# Patient Record
Sex: Male | Born: 1940 | Race: White | Hispanic: No | Smoking: Former smoker
Health system: Southern US, Community
[De-identification: ages and names within clinical notes are randomized; demographics above are authoritative.]

## PROBLEM LIST (undated history)

## (undated) DIAGNOSIS — I2510 Atherosclerotic heart disease of native coronary artery without angina pectoris: Secondary | ICD-10-CM

## (undated) DIAGNOSIS — J45909 Unspecified asthma, uncomplicated: Secondary | ICD-10-CM

## (undated) DIAGNOSIS — G4730 Sleep apnea, unspecified: Secondary | ICD-10-CM

## (undated) DIAGNOSIS — L84 Corns and callosities: Secondary | ICD-10-CM

## (undated) DIAGNOSIS — Z79899 Other long term (current) drug therapy: Secondary | ICD-10-CM

## (undated) DIAGNOSIS — I10 Essential (primary) hypertension: Secondary | ICD-10-CM

## (undated) DIAGNOSIS — M150 Primary generalized (osteo)arthritis: Secondary | ICD-10-CM

## (undated) DIAGNOSIS — F17200 Nicotine dependence, unspecified, uncomplicated: Secondary | ICD-10-CM

## (undated) DIAGNOSIS — M7741 Metatarsalgia, right foot: Secondary | ICD-10-CM

## (undated) DIAGNOSIS — E782 Mixed hyperlipidemia: Secondary | ICD-10-CM

## (undated) DIAGNOSIS — E538 Deficiency of other specified B group vitamins: Secondary | ICD-10-CM

## (undated) DIAGNOSIS — G4700 Insomnia, unspecified: Secondary | ICD-10-CM

## (undated) DIAGNOSIS — E119 Type 2 diabetes mellitus without complications: Secondary | ICD-10-CM

## (undated) DIAGNOSIS — K635 Polyp of colon: Secondary | ICD-10-CM

## (undated) DIAGNOSIS — G20 Parkinson's disease: Secondary | ICD-10-CM

## (undated) DIAGNOSIS — M722 Plantar fascial fibromatosis: Secondary | ICD-10-CM

## (undated) DIAGNOSIS — N400 Enlarged prostate without lower urinary tract symptoms: Secondary | ICD-10-CM

## (undated) DIAGNOSIS — Z01818 Encounter for other preprocedural examination: Secondary | ICD-10-CM

## (undated) DIAGNOSIS — F419 Anxiety disorder, unspecified: Secondary | ICD-10-CM

## (undated) DIAGNOSIS — J449 Chronic obstructive pulmonary disease, unspecified: Secondary | ICD-10-CM

## (undated) HISTORY — PX: CATARACT EXTRACTION: SUR2

## (undated) HISTORY — DX: Polyp of colon: K63.5

## (undated) HISTORY — DX: Anxiety disorder, unspecified: F41.9

## (undated) HISTORY — PX: SHOULDER SURGERY: SHX246

## (undated) HISTORY — DX: Corns and callosities: L84

## (undated) HISTORY — DX: Chronic obstructive pulmonary disease, unspecified: J44.9

## (undated) HISTORY — DX: Unspecified asthma, uncomplicated: J45.909

## (undated) HISTORY — DX: Metatarsalgia, right foot: M77.41

## (undated) HISTORY — DX: Sleep apnea, unspecified: G47.30

## (undated) HISTORY — PX: OTHER SURGICAL HISTORY: SHX169

## (undated) HISTORY — DX: Other long term (current) drug therapy: Z79.899

## (undated) HISTORY — PX: KNEE SURGERY: SHX244

## (undated) HISTORY — PX: BACK SURGERY: SHX140

## (undated) HISTORY — DX: Essential (primary) hypertension: I10

## (undated) HISTORY — DX: Plantar fascial fibromatosis: M72.2

## (undated) HISTORY — DX: Deficiency of other specified B group vitamins: E53.8

## (undated) HISTORY — DX: Nicotine dependence, unspecified, uncomplicated: F17.200

## (undated) HISTORY — DX: Atherosclerotic heart disease of native coronary artery without angina pectoris: I25.10

## (undated) HISTORY — DX: Benign prostatic hyperplasia without lower urinary tract symptoms: N40.0

## (undated) HISTORY — DX: Parkinson's disease: G20

## (undated) HISTORY — DX: Primary generalized (osteo)arthritis: M15.0

## (undated) HISTORY — DX: Type 2 diabetes mellitus without complications: E11.9

## (undated) HISTORY — DX: Mixed hyperlipidemia: E78.2

## (undated) HISTORY — DX: Insomnia, unspecified: G47.00

## (undated) HISTORY — DX: Encounter for other preprocedural examination: Z01.818

---

## 2002-11-10 ENCOUNTER — Encounter: Admission: RE | Admit: 2002-11-10 | Discharge: 2002-11-10 | Payer: Self-pay | Admitting: Unknown Physician Specialty

## 2002-11-10 ENCOUNTER — Encounter: Payer: Self-pay | Admitting: Unknown Physician Specialty

## 2008-01-12 ENCOUNTER — Encounter: Admission: RE | Admit: 2008-01-12 | Discharge: 2008-01-12 | Payer: Self-pay | Admitting: Internal Medicine

## 2015-04-10 DIAGNOSIS — F17200 Nicotine dependence, unspecified, uncomplicated: Secondary | ICD-10-CM

## 2015-04-10 DIAGNOSIS — I2510 Atherosclerotic heart disease of native coronary artery without angina pectoris: Secondary | ICD-10-CM

## 2015-04-10 DIAGNOSIS — IMO0001 Reserved for inherently not codable concepts without codable children: Secondary | ICD-10-CM | POA: Insufficient documentation

## 2015-04-10 HISTORY — DX: Atherosclerotic heart disease of native coronary artery without angina pectoris: I25.10

## 2015-04-10 HISTORY — DX: Reserved for inherently not codable concepts without codable children: IMO0001

## 2015-04-10 HISTORY — DX: Nicotine dependence, unspecified, uncomplicated: F17.200

## 2015-08-31 DIAGNOSIS — J45909 Unspecified asthma, uncomplicated: Secondary | ICD-10-CM

## 2015-08-31 DIAGNOSIS — E119 Type 2 diabetes mellitus without complications: Secondary | ICD-10-CM

## 2015-08-31 DIAGNOSIS — E782 Mixed hyperlipidemia: Secondary | ICD-10-CM

## 2015-08-31 DIAGNOSIS — N400 Enlarged prostate without lower urinary tract symptoms: Secondary | ICD-10-CM

## 2015-08-31 DIAGNOSIS — G4730 Sleep apnea, unspecified: Secondary | ICD-10-CM

## 2015-08-31 DIAGNOSIS — G20 Parkinson's disease: Secondary | ICD-10-CM

## 2015-08-31 DIAGNOSIS — F419 Anxiety disorder, unspecified: Secondary | ICD-10-CM

## 2015-08-31 DIAGNOSIS — I10 Essential (primary) hypertension: Secondary | ICD-10-CM

## 2015-08-31 DIAGNOSIS — Z79899 Other long term (current) drug therapy: Secondary | ICD-10-CM

## 2015-08-31 DIAGNOSIS — J4489 Other specified chronic obstructive pulmonary disease: Secondary | ICD-10-CM

## 2015-08-31 DIAGNOSIS — R7303 Prediabetes: Secondary | ICD-10-CM

## 2015-08-31 DIAGNOSIS — G4700 Insomnia, unspecified: Secondary | ICD-10-CM

## 2015-08-31 DIAGNOSIS — I25812 Atherosclerosis of bypass graft of coronary artery of transplanted heart without angina pectoris: Secondary | ICD-10-CM

## 2015-08-31 DIAGNOSIS — I2510 Atherosclerotic heart disease of native coronary artery without angina pectoris: Secondary | ICD-10-CM

## 2015-08-31 DIAGNOSIS — J449 Chronic obstructive pulmonary disease, unspecified: Secondary | ICD-10-CM

## 2015-08-31 DIAGNOSIS — G20A1 Parkinson's disease without dyskinesia, without mention of fluctuations: Secondary | ICD-10-CM

## 2015-08-31 HISTORY — DX: Atherosclerotic heart disease of native coronary artery without angina pectoris: I25.10

## 2015-08-31 HISTORY — DX: Essential (primary) hypertension: I10

## 2015-08-31 HISTORY — DX: Parkinson's disease without dyskinesia, without mention of fluctuations: G20.A1

## 2015-08-31 HISTORY — DX: Anxiety disorder, unspecified: F41.9

## 2015-08-31 HISTORY — DX: Chronic obstructive pulmonary disease, unspecified: J44.9

## 2015-08-31 HISTORY — DX: Unspecified asthma, uncomplicated: J45.909

## 2015-08-31 HISTORY — DX: Other specified chronic obstructive pulmonary disease: J44.89

## 2015-08-31 HISTORY — DX: Benign prostatic hyperplasia without lower urinary tract symptoms: N40.0

## 2015-08-31 HISTORY — DX: Mixed hyperlipidemia: E78.2

## 2015-08-31 HISTORY — DX: Type 2 diabetes mellitus without complications: E11.9

## 2015-08-31 HISTORY — DX: Sleep apnea, unspecified: G47.30

## 2015-08-31 HISTORY — DX: Parkinson's disease: G20

## 2015-08-31 HISTORY — DX: Atherosclerosis of bypass graft of coronary artery of transplanted heart without angina pectoris: I25.812

## 2015-08-31 HISTORY — DX: Other long term (current) drug therapy: Z79.899

## 2015-08-31 HISTORY — DX: Insomnia, unspecified: G47.00

## 2015-08-31 HISTORY — DX: Prediabetes: R73.03

## 2015-09-03 DIAGNOSIS — M8949 Other hypertrophic osteoarthropathy, multiple sites: Secondary | ICD-10-CM

## 2015-09-03 DIAGNOSIS — E538 Deficiency of other specified B group vitamins: Secondary | ICD-10-CM | POA: Insufficient documentation

## 2015-09-03 DIAGNOSIS — M150 Primary generalized (osteo)arthritis: Secondary | ICD-10-CM

## 2015-09-03 DIAGNOSIS — M159 Polyosteoarthritis, unspecified: Secondary | ICD-10-CM

## 2015-09-03 DIAGNOSIS — K635 Polyp of colon: Secondary | ICD-10-CM

## 2015-09-03 HISTORY — DX: Primary generalized (osteo)arthritis: M15.0

## 2015-09-03 HISTORY — DX: Polyp of colon: K63.5

## 2015-09-03 HISTORY — DX: Deficiency of other specified B group vitamins: E53.8

## 2015-09-03 HISTORY — DX: Other hypertrophic osteoarthropathy, multiple sites: M89.49

## 2015-09-03 HISTORY — DX: Polyosteoarthritis, unspecified: M15.9

## 2015-12-11 DIAGNOSIS — M7741 Metatarsalgia, right foot: Secondary | ICD-10-CM

## 2015-12-11 DIAGNOSIS — L84 Corns and callosities: Secondary | ICD-10-CM

## 2015-12-11 HISTORY — DX: Metatarsalgia, right foot: M77.41

## 2015-12-11 HISTORY — DX: Corns and callosities: L84

## 2016-03-11 DIAGNOSIS — M722 Plantar fascial fibromatosis: Secondary | ICD-10-CM

## 2016-03-11 HISTORY — DX: Plantar fascial fibromatosis: M72.2

## 2016-04-17 ENCOUNTER — Other Ambulatory Visit: Payer: Self-pay

## 2016-04-17 ENCOUNTER — Encounter: Payer: Self-pay | Admitting: Sports Medicine

## 2016-04-17 ENCOUNTER — Ambulatory Visit (INDEPENDENT_AMBULATORY_CARE_PROVIDER_SITE_OTHER): Payer: Medicare Other | Admitting: Sports Medicine

## 2016-04-17 DIAGNOSIS — Q828 Other specified congenital malformations of skin: Secondary | ICD-10-CM

## 2016-04-17 DIAGNOSIS — I739 Peripheral vascular disease, unspecified: Secondary | ICD-10-CM

## 2016-04-17 DIAGNOSIS — M79671 Pain in right foot: Secondary | ICD-10-CM

## 2016-04-17 DIAGNOSIS — B359 Dermatophytosis, unspecified: Secondary | ICD-10-CM | POA: Diagnosis not present

## 2016-04-17 NOTE — Progress Notes (Signed)
Subjective: Allen Barnes is a 75 y.o. male patient who presents to office for evaluation of Right foot pain secondary to callus skin. Patient complains of pain at the lesion present Right heel. Patient has tried padding with no relief in symptoms. Patient denies any other pedal complaints.   Patient Active Problem List   Diagnosis Date Noted  . Plantar fasciitis of right foot 03/11/2016  . Callus of foot 12/11/2015  . Metatarsalgia of right foot 12/11/2015  . Colon polyps 09/03/2015  . Primary osteoarthritis involving multiple joints 09/03/2015  . Vitamin B12 deficiency 09/03/2015  . Anxiety disorder 08/31/2015  . Asthma 08/31/2015  . BPH without urinary obstruction 08/31/2015  . COPD (chronic obstructive pulmonary disease) with chronic bronchitis ([place]) 08/31/2015  . Coronary artery stenosis 08/31/2015  . Diabetes mellitus type 2, controlled ([place]) 08/31/2015  . Essential hypertension 08/31/2015  . High risk medication use 08/31/2015  . Insomnia 08/31/2015  . Mixed hyperlipidemia 08/31/2015  . Parkinson's disease ([place]) 08/31/2015  . Sleep apnea 08/31/2015  . Coronary artery disease involving native coronary artery of native heart without angina pectoris 04/10/2015  . Smoking 04/10/2015    No current outpatient prescriptions on file prior to visit.   No current facility-administered medications on file prior to visit.     Allergies  Allergen Reactions  . Carbidopa-Levodopa Nausea And Vomiting    Mainly vomiting  . Tramadol Rash    Objective:  General: Alert and oriented x3 in no acute distress  Dermatology: Keratotic lesion present Plantar right heel skin lines transversing the lesion, pain is present with direct pressure to the lesion with a central nucleated core noted, dry, scaly skin plantar surfaces bilateral, consistent with tinea, no webspace macerations, no ecchymosis bilateral, all nails x 10 are thick but well manicured.  Vascular: Dorsalis Pedis 1 out of 4 and  Posterior Tibial pedal pulses 0 out of 4, Capillary Fill Time 5 seconds, no pedal hair growth bilateral, no edema bilateral lower extremities, Temperature gradient decreased bilateral.  Neurology: Gross sensation intact via light touch bilateral.  Musculoskeletal: Extreme tenderness with palpation at the keratotic lesion site on right heel, Muscular strength 5/5 in all groups without pain or limitation on range of motion. No lower extremity muscular or boney deformity noted however there appears to be some atrophy to the limbs.  Assessment and Plan: Problem List Items Addressed This Visit    None    Visit Diagnoses    Porokeratosis    -  Primary   Pain of right heel       Tinea       PAD (peripheral artery disease) (HCC)       Relevant Medications   losartan (COZAAR) 100 MG tablet     -Complete examination performed -Discussed treatment options for porokeratosis and tinea -For symptomatic relief, injected right heel with 1:1 mixture of 1% lidocaine plain and 0.5% Marcaine plain, so that I trimmed down the keratotic lesion -Parred keratoic lesion using a 15 blade; treated the area with offloading pad and antibiotic cream and Band-Aid. Advised patient to do the same at home until seen next visit -Encouraged daily skin emollients and to pick up over-the-counter cream for athlete's foot -Advised good supportive shoes and inserts -Ordered vascular studies in the setting of decreased pedal pulses 2 evaluate for peripheral arterial disease. Since patient is a smoker with complicated medical history including coronary artery disease with stenosis, hypertension, COPD Parkinson's and a past history of diabetes, however patient denies current status of  diabetes -Patient to return to office  in 2 weeks for recheck of keratosis site right heel or sooner if condition worsens.  Landis Martins, DPM

## 2016-04-18 ENCOUNTER — Telehealth: Payer: Self-pay | Admitting: *Deleted

## 2016-04-18 DIAGNOSIS — R0989 Other specified symptoms and signs involving the circulatory and respiratory systems: Secondary | ICD-10-CM

## 2016-04-18 NOTE — Telephone Encounter (Addendum)
-----   Message from Landis Martins, [place] sent at 04/17/2016  9:06 AM EDT ----- Regarding: Vascular studies Diminished pedal pulses ABI/PVRs bilateral   Dr. Cannon Kettle. 04/18/2016-Orders faxed to [facility].

## 2016-05-01 ENCOUNTER — Ambulatory Visit (INDEPENDENT_AMBULATORY_CARE_PROVIDER_SITE_OTHER): Payer: Medicare Other | Admitting: Sports Medicine

## 2016-05-01 ENCOUNTER — Encounter: Payer: Self-pay | Admitting: Sports Medicine

## 2016-05-01 DIAGNOSIS — Z09 Encounter for follow-up examination after completed treatment for conditions other than malignant neoplasm: Secondary | ICD-10-CM

## 2016-05-01 DIAGNOSIS — I739 Peripheral vascular disease, unspecified: Secondary | ICD-10-CM

## 2016-05-01 DIAGNOSIS — B359 Dermatophytosis, unspecified: Secondary | ICD-10-CM

## 2016-05-01 DIAGNOSIS — M79671 Pain in right foot: Secondary | ICD-10-CM

## 2016-05-01 DIAGNOSIS — R0989 Other specified symptoms and signs involving the circulatory and respiratory systems: Secondary | ICD-10-CM

## 2016-05-01 NOTE — Telephone Encounter (Signed)
-----   Message from Landis Martins, [place] sent at 05/01/2016 12:09 PM EDT ----- Regarding: Vascular studies We will put vascular studies on hold. Patient does not want to get them done at this time. Thanks Dr Cannon Kettle

## 2016-05-01 NOTE — Progress Notes (Signed)
Subjective: Allen Barnes is a 75 y.o. male patient who returns to office for follow up evaluation s/p excision of porokeratosis right heel. Patient states that the area "feels good" dressed area with antibiotic and padding with no issues. Patient denies any other pedal complaints.   Patient Active Problem List   Diagnosis Date Noted  . Plantar fasciitis of right foot 03/11/2016  . Callus of foot 12/11/2015  . Metatarsalgia of right foot 12/11/2015  . Colon polyps 09/03/2015  . Primary osteoarthritis involving multiple joints 09/03/2015  . Vitamin B12 deficiency 09/03/2015  . Anxiety disorder 08/31/2015  . Asthma 08/31/2015  . BPH without urinary obstruction 08/31/2015  . COPD (chronic obstructive pulmonary disease) with chronic bronchitis ([place]) 08/31/2015  . Coronary artery stenosis 08/31/2015  . Diabetes mellitus type 2, controlled ([place]) 08/31/2015  . Essential hypertension 08/31/2015  . High risk medication use 08/31/2015  . Insomnia 08/31/2015  . Mixed hyperlipidemia 08/31/2015  . Parkinson's disease ([place]) 08/31/2015  . Sleep apnea 08/31/2015  . Coronary artery disease involving native coronary artery of native heart without angina pectoris 04/10/2015  . Smoking 04/10/2015    Current Outpatient Prescriptions on File Prior to Visit  Medication Sig Dispense Refill  . acetaminophen (TYLENOL) 650 MG CR tablet Take 650 mg by mouth.    Marland Kitchen albuterol (PROVENTIL HFA;VENTOLIN HFA) 108 (90 Base) MCG/ACT inhaler Inhale into the lungs.    . ALPRAZolam (XANAX) 1 MG tablet Take one-half to one by mouth 8 hours prn rescue/anxiety.    Marland Kitchen amLODipine (NORVASC) 10 MG tablet Take 10 mg by mouth.    Marland Kitchen aspirin EC 81 MG tablet Take 81 mg by mouth.    . betamethasone acetate-betamethasone sodium phosphate (CELESTONE) 6 (3-3) MG/ML injection 3 mg.    . chlorthalidone (HYGROTON) 25 MG tablet Take 25 mg by mouth.    . Cholecalciferol (VITAMIN D3) 2000 units capsule Take by mouth.    . ciprofloxacin (CIPRO)  500 MG tablet     . Cyanocobalamin (RA VITAMIN B-12 TR) 1000 MCG TBCR Take by mouth.    . docusate (COLACE) 50 MG/5ML liquid Take 200 mg by mouth.    . furosemide (LASIX) 20 MG tablet Take 20 mg by mouth.    Marland Kitchen ketoconazole (NIZORAL) 2 % cream Apply topically.    Marland Kitchen losartan (COZAAR) 100 MG tablet     . OXYGEN by Misc.(Non-Drug; Combo Route) route.    . pravastatin (PRAVACHOL) 20 MG tablet Take 20 mg by mouth.    . tamsulosin (FLOMAX) 0.4 MG CAPS capsule      No current facility-administered medications on file prior to visit.     Allergies  Allergen Reactions  . Carbidopa-Levodopa Nausea And Vomiting    Mainly vomiting  . Tramadol Rash    Objective:  General: Alert and oriented x3 in no acute distress  Dermatology: Excision site well healed at Plantar right heel with no signs of infection, scaly skin plantar surfaces bilateral, consistent with tinea, no webspace macerations, no ecchymosis bilateral, all nails x 10 are thick but well manicured.  Vascular: Dorsalis Pedis 1 out of 4 and Posterior Tibial pedal pulses 0 out of 4, Capillary Fill Time 5 seconds, no pedal hair growth bilateral, no edema bilateral lower extremities, Temperature gradient decreased bilateral.  Neurology: Gross sensation intact via light touch bilateral.  Musculoskeletal: No tenderness to excision site at right heel, Muscular strength 5/5 in all groups without pain or limitation on range of motion. No lower extremity muscular or boney deformity  noted however there appears to be some atrophy to the limbs.  Assessment and Plan: Problem List Items Addressed This Visit    None    Visit Diagnoses    S/P excision of skin lesion, follow-up exam    -  Primary   Pain of right heel       Resolved   Diminished pulses in lower extremity       Tinea       PAD (peripheral artery disease) (HCC)         -Complete examination performed -Excision site well healed, dressings or padding no longer needed -Encouraged  continue with daily skin emollients and to pick up over-the-counter cream for athlete's foot -Advised good supportive shoes and inserts -Patient declines vascular testing at this time; states that he does not want to go to [place] to have tests done. Advised patient that we will closely monitor feet and legs and if any changes occur or pain to return to office -Patient to return to office as needed or sooner if condition worsens.  Landis Martins, DPM

## 2016-10-23 DIAGNOSIS — F17200 Nicotine dependence, unspecified, uncomplicated: Secondary | ICD-10-CM

## 2016-10-23 HISTORY — DX: Nicotine dependence, unspecified, uncomplicated: F17.200

## 2017-08-17 ENCOUNTER — Ambulatory Visit: Payer: Self-pay | Admitting: Cardiovascular Disease

## 2017-08-17 NOTE — Progress Notes (Deleted)
No chief complaint on file.  History of Present Illness: 77 yo male with history of COPD, CAD, DM, HTN, HLD, Parkinson's disease, tobacco abuse and sleep apnea who is here today as a new consult for evaluation of ***, referred by Dr. Bea Graff. He is known to have CAD. Last cardiac cath in 2007 and per records there was a 30% stenosis in the LAD. He was followed previously in [place] by Dr. Agustin Cree. He tells me that he   Primary Care Physician: Raina Mina., MD  Past Medical History:  Diagnosis Date  . Anxiety disorder 08/31/2015  . Asthma 08/31/2015  . BPH without urinary obstruction 08/31/2015  . Callus of foot 12/11/2015  . Colon polyps 09/03/2015   Overview:  [place]. 2017. Polyps.  Q 3 yrs.  Marland Kitchen COPD (chronic obstructive pulmonary disease) with chronic bronchitis ([place]) 08/31/2015   Overview:  Wears O2 at night.  . Coronary artery disease involving native coronary artery of native heart without angina pectoris 04/10/2015   Overview:  30% of LAD in 2007  . Coronary artery stenosis 08/31/2015   Overview:  30% LAD in 2007  . Diabetes mellitus type 2, controlled ([place]) 08/31/2015  . Essential hypertension 08/31/2015  . High risk medication use 08/31/2015  . Insomnia 08/31/2015  . Metatarsalgia of right foot 12/11/2015  . Mixed hyperlipidemia 08/31/2015  . Parkinson's disease ([place]) 08/31/2015  . Plantar fasciitis of right foot 03/11/2016  . Primary osteoarthritis involving multiple joints 09/03/2015  . Sleep apnea 08/31/2015  . Smoking 04/10/2015  . Vitamin B12 deficiency 09/03/2015    *** The histories are not reviewed yet. Please review them in the "History" navigator section and refresh this [place].  Current Outpatient Medications  Medication Sig Dispense Refill  . acetaminophen (TYLENOL) 650 MG CR tablet Take 650 mg by mouth.    Marland Kitchen albuterol (PROVENTIL HFA;VENTOLIN HFA) 108 (90 Base) MCG/ACT inhaler Inhale into the lungs.    . ALPRAZolam (XANAX) 1 MG tablet Take one-half to one by mouth 8 hours  prn rescue/anxiety.    Marland Kitchen amLODipine (NORVASC) 10 MG tablet Take 10 mg by mouth.    Marland Kitchen aspirin EC 81 MG tablet Take 81 mg by mouth.    . betamethasone acetate-betamethasone sodium phosphate (CELESTONE) 6 (3-3) MG/ML injection 3 mg.    . chlorthalidone (HYGROTON) 25 MG tablet Take 25 mg by mouth.    . Cholecalciferol (VITAMIN D3) 2000 units capsule Take by mouth.    . ciprofloxacin (CIPRO) 500 MG tablet     . Cyanocobalamin (RA VITAMIN B-12 TR) 1000 MCG TBCR Take by mouth.    . docusate (COLACE) 50 MG/5ML liquid Take 200 mg by mouth.    . furosemide (LASIX) 20 MG tablet Take 20 mg by mouth.    Marland Kitchen ketoconazole (NIZORAL) 2 % cream Apply topically.    Marland Kitchen losartan (COZAAR) 100 MG tablet     . OXYGEN by Misc.(Non-Drug; Combo Route) route.    . pravastatin (PRAVACHOL) 20 MG tablet Take 20 mg by mouth.    . tamsulosin (FLOMAX) 0.4 MG CAPS capsule      No current facility-administered medications for this visit.     Allergies  Allergen Reactions  . Carbidopa-Levodopa Nausea And Vomiting    Mainly vomiting  . Tramadol Rash    Social History   Socioeconomic History  . Marital status: Divorced    Spouse name: Not on file  . Number of children: Not on file  . Years of education: Not on file  .  Highest education level: Not on file  Social Needs  . Financial resource strain: Not on file  . Food insecurity - worry: Not on file  . Food insecurity - inability: Not on file  . Transportation needs - medical: Not on file  . Transportation needs - non-medical: Not on file  Occupational History  . Not on file  Tobacco Use  . Smoking status: Unknown If Ever Smoked  Substance and Sexual Activity  . Alcohol use: Not on file  . Drug use: Not on file  . Sexual activity: Not on file  Other Topics Concern  . Not on file  Social History Narrative  . Not on file    No family history on file.  Review of Systems:  As stated in the HPI and otherwise negative.   There were no vitals taken for this  visit.  Physical Examination: General: Well developed, well nourished, NAD  HEENT: OP clear, mucus membranes moist  SKIN: warm, dry. No rashes. Neuro: No focal deficits  Musculoskeletal: Muscle strength 5/5 all ext  Psychiatric: Mood and affect normal  Neck: No JVD, no carotid bruits, no thyromegaly, no lymphadenopathy.  Lungs:Clear bilaterally, no wheezes, rhonci, crackles Cardiovascular: Regular rate and rhythm. No murmurs, gallops or rubs. Abdomen:Soft. Bowel sounds present. Non-tender.  Extremities: No lower extremity edema. Pulses are 2 + in the bilateral DP/PT.  EKG:  EKG {ACTION; IS/IS [id number]} ordered today. The ekg ordered today demonstrates ***  Recent Labs: No results found for requested labs within last 8760 hours.   Lipid Panel No results found for: CHOL, TRIG, HDL, CHOLHDL, VLDL, LDLCALC, LDLDIRECT   Wt Readings from Last 3 Encounters:  No data found for Wt     Other studies Reviewed: Additional studies/ records that were reviewed today include: ***. Review of the above records demonstrates: ***   Assessment and Plan:   1. CAD without angina:   Current medicines are reviewed at length with the patient today.  The patient {ACTIONS; HAS/DOES NOT HAVE:19233} concerns regarding medicines.  The following changes have been made:  {PLAN; NO CHANGE:13088:s}  Labs/ tests ordered today include: *** No orders of the defined types were placed in this encounter.    Disposition:   FU with *** in {gen number [id number]} {TIME; UNITS DAY/WEEK/MONTH:19136}   Signed, Lauree Chandler, MD 08/17/2017 9:49 AM    [facility] [place], [place], [place]  [address] Phone: [phone]; Fax: [phone]

## 2017-08-19 ENCOUNTER — Encounter: Payer: Self-pay | Admitting: Cardiovascular Disease

## 2018-05-03 DIAGNOSIS — I272 Pulmonary hypertension, unspecified: Secondary | ICD-10-CM

## 2018-05-18 DIAGNOSIS — K8020 Calculus of gallbladder without cholecystitis without obstruction: Secondary | ICD-10-CM

## 2018-05-18 DIAGNOSIS — I6523 Occlusion and stenosis of bilateral carotid arteries: Secondary | ICD-10-CM | POA: Insufficient documentation

## 2018-05-18 HISTORY — DX: Occlusion and stenosis of bilateral carotid arteries: I65.23

## 2018-05-18 HISTORY — DX: Calculus of gallbladder without cholecystitis without obstruction: K80.20

## 2018-08-14 DIAGNOSIS — J441 Chronic obstructive pulmonary disease with (acute) exacerbation: Secondary | ICD-10-CM

## 2018-08-14 DIAGNOSIS — J101 Influenza due to other identified influenza virus with other respiratory manifestations: Secondary | ICD-10-CM

## 2018-08-14 DIAGNOSIS — D72819 Decreased white blood cell count, unspecified: Secondary | ICD-10-CM

## 2018-11-18 DIAGNOSIS — L851 Acquired keratosis [keratoderma] palmaris et plantaris: Secondary | ICD-10-CM

## 2018-11-18 HISTORY — DX: Acquired keratosis (keratoderma) palmaris et plantaris: L85.1

## 2018-12-02 DIAGNOSIS — M79671 Pain in right foot: Secondary | ICD-10-CM | POA: Insufficient documentation

## 2018-12-02 HISTORY — DX: Pain in right foot: M79.671

## 2019-02-02 DIAGNOSIS — M216X1 Other acquired deformities of right foot: Secondary | ICD-10-CM | POA: Insufficient documentation

## 2019-02-02 HISTORY — DX: Other acquired deformities of right foot: M21.6X1

## 2019-02-21 DIAGNOSIS — I700 Atherosclerosis of aorta: Secondary | ICD-10-CM

## 2019-02-21 HISTORY — DX: Atherosclerosis of aorta: I70.0

## 2019-06-02 DIAGNOSIS — R3129 Other microscopic hematuria: Secondary | ICD-10-CM | POA: Insufficient documentation

## 2019-06-02 HISTORY — DX: Other microscopic hematuria: R31.29

## 2019-07-06 ENCOUNTER — Encounter: Payer: Self-pay | Admitting: Cardiology

## 2019-07-06 ENCOUNTER — Ambulatory Visit (INDEPENDENT_AMBULATORY_CARE_PROVIDER_SITE_OTHER): Payer: Medicare Other | Admitting: Cardiology

## 2019-07-06 ENCOUNTER — Other Ambulatory Visit: Payer: Self-pay

## 2019-07-06 VITALS — BP 162/90 | HR 74 | Ht 72.0 in | Wt 198.0 lb

## 2019-07-06 DIAGNOSIS — I2510 Atherosclerotic heart disease of native coronary artery without angina pectoris: Secondary | ICD-10-CM | POA: Diagnosis not present

## 2019-07-06 DIAGNOSIS — G20 Parkinson's disease: Secondary | ICD-10-CM | POA: Diagnosis not present

## 2019-07-06 DIAGNOSIS — I10 Essential (primary) hypertension: Secondary | ICD-10-CM | POA: Diagnosis not present

## 2019-07-06 DIAGNOSIS — E782 Mixed hyperlipidemia: Secondary | ICD-10-CM | POA: Diagnosis not present

## 2019-07-06 MED ORDER — NITROGLYCERIN 0.4 MG SL SUBL: 0.4000 mg | SUBLINGUAL_TABLET | SUBLINGUAL | 1 refills | Status: DC | PRN

## 2019-07-06 MED ORDER — RANOLAZINE ER 500 MG PO TB12: 500.0000 mg | ORAL_TABLET | Freq: Two times a day (BID) | ORAL | 2 refills | Status: DC

## 2019-07-06 NOTE — Patient Instructions (Addendum)
Medication Instructions:  Your physician has recommended you make the following change in your medication:   START: Ranolazine 500 mg twice daily   TAKE as needed for chest pain: Nitroglycerin 0.4 mg sublingual (under your tongue) as needed for chest pain. If experiencing chest pain, stop what you are doing and sit down. Take 1 nitroglycerin and wait 5 minutes. If chest pain continues, take another nitroglycerin and wait 5 minutes. If chest pain does not subside, take 1 more nitroglycerin and dial 911. You make take a total of 3 nitroglycerin in a 15 minute time frame.  *If you need a refill on your cardiac medications before your next appointment, please call your pharmacy*  Lab Work: None.  If you have labs (blood work) drawn today and your tests are completely normal, you will receive your results only by: Marland Kitchen MyChart Message (if you have MyChart) OR . A paper copy in the mail If you have any lab test that is abnormal or we need to change your treatment, we will call you to review the results.  Testing/Procedures: Your physician has requested that you have a lexiscan myoview. For further information please visit [email or web address]. Please follow instruction sheet, as given.    Follow-Up: At [facility], you and your health needs are our priority.  As part of our continuing mission to provide you with exceptional heart care, we have created designated Provider Care Teams.  These Care Teams include your primary Cardiologist (physician) and Advanced Practice Providers (APPs -  Physician Assistants and Nurse Practitioners) who all work together to provide you with the care you need, when you need it.  Your next appointment:   1 month(s)  The format for your next appointment:   In Person  Provider:   Jenne Campus, MD  Other Instructions  Ranolazine tablets, extended release What is this medicine? RANOLAZINE (ra NOE la zeen) is a heart medicine. It is used to treat chronic  chest pain (angina). This medicine must be taken regularly. It will not relieve an acute episode of chest pain. This medicine may be used for other purposes; ask your health care provider or pharmacist if you have questions. COMMON BRAND NAME(S): Ranexa What should I tell my health care provider before I take this medicine? They need to know if you have any of these conditions:  heart disease  irregular heartbeat  kidney disease  liver disease  low levels of potassium or magnesium in the blood  an unusual or allergic reaction to ranolazine, other medicines, foods, dyes, or preservatives  pregnant or trying to get pregnant  breast-feeding How should I use this medicine? Take this medicine by mouth with a glass of water. Follow the directions on the prescription label. Do not cut, crush, or chew this medicine. Take with or without food. Do not take this medication with grapefruit juice. Take your doses at regular intervals. Do not take your medicine more often then directed. Talk to your pediatrician regarding the use of this medicine in children. Special care may be needed. Overdosage: If you think you have taken too much of this medicine contact a poison control center or emergency room at once. NOTE: This medicine is only for you. Do not share this medicine with others. What if I miss a dose? If you miss a dose, take it as soon as you can. If it is almost time for your next dose, take only that dose. Do not take double or extra doses. What may interact with this  medicine? Do not take this medicine with any of the following medications:  antivirals for HIV or AIDS  cerivastatin  certain antibiotics like chloramphenicol, clarithromycin, dalfopristin; quinupristin, isoniazid, rifabutin, rifampin, rifapentine  certain medicines used for cancer like imatinib, nilotinib  certain medicines for fungal infections like fluconazole, itraconazole, ketoconazole, posaconazole,  voriconazole  certain medicines for irregular heart beat like dronedarone  certain medicines for seizures like carbamazepine, fosphenytoin, oxcarbazepine, phenobarbital, phenytoin  cisapride  conivaptan  cyclosporine  grapefruit or grapefruit juice  lumacaftor; ivacaftor  nefazodone  pimozide  quinacrine  St John's wort  thioridazine This medicine may also interact with the following medications:  alfuzosin  certain medicines for depression, anxiety, or psychotic disturbances like bupropion, citalopram, fluoxetine, fluphenazine, paroxetine, perphenazine, risperidone, sertraline, trifluoperazine  certain medicines for cholesterol like atorvastatin, lovastatin, simvastatin  certain medicines for stomach problems like octreotide, palonosetron, prochlorperazine  eplerenone  ergot alkaloids like dihydroergotamine, ergonovine, ergotamine, methylergonovine  metformin  nicardipine  other medicines that prolong the QT interval (cause an abnormal heart rhythm) like dofetilide, ziprasidone  sirolimus  tacrolimus This list may not describe all possible interactions. Give your health care provider a list of all the medicines, herbs, non-prescription drugs, or dietary supplements you use. Also tell them if you smoke, drink alcohol, or use illegal drugs. Some items may interact with your medicine. What should I watch for while using this medicine? Visit your doctor for regular check ups. Tell your doctor or healthcare professional if your symptoms do not start to get better or if they get worse. This medicine will not relieve an acute attack of angina or chest pain. This medicine can change your heart rhythm. Your health care provider may check your heart rhythm by ordering an electrocardiogram (ECG) while you are taking this medicine. You may get drowsy or dizzy. Do not drive, use machinery, or do anything that needs mental alertness until you know how this medicine affects you.  Do not stand or sit up quickly, especially if you are an older patient. This reduces the risk of dizzy or fainting spells. Alcohol may interfere with the effect of this medicine. Avoid alcoholic drinks. If you are scheduled for any medical or dental procedure, tell your healthcare provider that you are taking this medicine. This medicine can interact with other medicines used during surgery. What side effects may I notice from receiving this medicine? Side effects that you should report to your doctor or health care professional as soon as possible:  allergic reactions like skin rash, itching or hives, swelling of the face, lips, or tongue  breathing problems  changes in vision  fast, irregular or pounding heartbeat  feeling faint or lightheaded, falls  low or high blood pressure  numbness or tingling feelings  ringing in the ears  tremor or shakiness  slow heartbeat (fewer than 50 beats per minute)  swelling of the legs or feet Side effects that usually do not require medical attention (report to your doctor or health care professional if they continue or are bothersome):  constipation  drowsy  dry mouth  headache  nausea or vomiting  stomach upset This list may not describe all possible side effects. Call your doctor for medical advice about side effects. You may report side effects to FDA at 1-800-FDA-1088. Where should I keep my medicine? Keep out of the reach of children. Store at room temperature between 15 and 30 degrees C (59 and 86 degrees F). Throw away any unused medicine after the expiration date. NOTE: This  sheet is a summary. It may not cover all possible information. If you have questions about this medicine, talk to your doctor, pharmacist, or health care provider.  2020 Elsevier/Gold Standard (2018-06-08 09:18:49)  Nitroglycerin sublingual tablets What is this medicine? NITROGLYCERIN (nye troe GLI ser in) is a type of vasodilator. It relaxes blood  vessels, increasing the blood and oxygen supply to your heart. This medicine is used to relieve chest pain caused by angina. It is also used to prevent chest pain before activities like climbing stairs, going outdoors in cold weather, or sexual activity. This medicine may be used for other purposes; ask your health care provider or pharmacist if you have questions. COMMON BRAND NAME(S): Nitroquick, Nitrostat, Nitrotab What should I tell my health care provider before I take this medicine? They need to know if you have any of these conditions:  anemia  head injury, recent stroke, or bleeding in the brain  liver disease  previous heart attack  an unusual or allergic reaction to nitroglycerin, other medicines, foods, dyes, or preservatives  pregnant or trying to get pregnant  breast-feeding How should I use this medicine? Take this medicine by mouth as needed. At the first sign of an angina attack (chest pain or tightness) place one tablet under your tongue. You can also take this medicine 5 to 10 minutes before an event likely to produce chest pain. Follow the directions on the prescription label. Let the tablet dissolve under the tongue. Do not swallow whole. Replace the dose if you accidentally swallow it. It will help if your mouth is not dry. Saliva around the tablet will help it to dissolve more quickly. Do not eat or drink, smoke or chew tobacco while a tablet is dissolving. If you are not better within 5 minutes after taking ONE dose of nitroglycerin, call 9-1-1 immediately to seek emergency medical care. Do not take more than 3 nitroglycerin tablets over 15 minutes. If you take this medicine often to relieve symptoms of angina, your doctor or health care professional may provide you with different instructions to manage your symptoms. If symptoms do not go away after following these instructions, it is important to call 9-1-1 immediately. Do not take more than 3 nitroglycerin tablets over  15 minutes. Talk to your pediatrician regarding the use of this medicine in children. Special care may be needed. Overdosage: If you think you have taken too much of this medicine contact a poison control center or emergency room at once. NOTE: This medicine is only for you. Do not share this medicine with others. What if I miss a dose? This does not apply. This medicine is only used as needed. What may interact with this medicine? Do not take this medicine with any of the following medications:  certain migraine medicines like ergotamine and dihydroergotamine (DHE)  medicines used to treat erectile dysfunction like sildenafil, tadalafil, and vardenafil  riociguat This medicine may also interact with the following medications:  alteplase  aspirin  heparin  medicines for high blood pressure  medicines for mental depression  other medicines used to treat angina  phenothiazines like chlorpromazine, mesoridazine, prochlorperazine, thioridazine This list may not describe all possible interactions. Give your health care provider a list of all the medicines, herbs, non-prescription drugs, or dietary supplements you use. Also tell them if you smoke, drink alcohol, or use illegal drugs. Some items may interact with your medicine. What should I watch for while using this medicine? Tell your doctor or health care professional if  you feel your medicine is no longer working. Keep this medicine with you at all times. Sit or lie down when you take your medicine to prevent falling if you feel dizzy or faint after using it. Try to remain calm. This will help you to feel better faster. If you feel dizzy, take several deep breaths and lie down with your feet propped up, or bend forward with your head resting between your knees. You may get drowsy or dizzy. Do not drive, use machinery, or do anything that needs mental alertness until you know how this drug affects you. Do not stand or sit up quickly,  especially if you are an older patient. This reduces the risk of dizzy or fainting spells. Alcohol can make you more drowsy and dizzy. Avoid alcoholic drinks. Do not treat yourself for coughs, colds, or pain while you are taking this medicine without asking your doctor or health care professional for advice. Some ingredients may increase your blood pressure. What side effects may I notice from receiving this medicine? Side effects that you should report to your doctor or health care professional as soon as possible:  blurred vision  dry mouth  skin rash  sweating  the feeling of extreme pressure in the head  unusually weak or tired Side effects that usually do not require medical attention (report to your doctor or health care professional if they continue or are bothersome):  flushing of the face or neck  headache  irregular heartbeat, palpitations  nausea, vomiting This list may not describe all possible side effects. Call your doctor for medical advice about side effects. You may report side effects to FDA at 1-800-FDA-1088. Where should I keep my medicine? Keep out of the reach of children. Store at room temperature between 20 and 25 degrees C (68 and 77 degrees F). Store in Chief of Staff. Protect from light and moisture. Keep tightly closed. Throw away any unused medicine after the expiration date. NOTE: This sheet is a summary. It may not cover all possible information. If you have questions about this medicine, talk to your doctor, pharmacist, or health care provider.  2020 Elsevier/Gold Standard (2013-04-14 17:57:36)

## 2019-07-06 NOTE — Progress Notes (Signed)
Cardiology Consultation:    Date:  07/06/2019   ID:  Allen Barnes, DOB [birth year], MRN [id number]  PCP:  Raina Mina., MD  Cardiologist:  Jenne Campus, MD   Referring MD: Raina Mina., MD   Chief Complaint  Patient presents with  . New Patient (Initial Visit)  I have chest pain  History of Present Illness:    Allen Barnes is a 79 y.o. male who is being seen today for the evaluation of chest pain at the request of Raina Mina., MD.  Possible ago history significant for coronary artery disease in 2007 he got cardiac catheterization which showed only luminal disease.  His past medical history is also significant for B12 deficiency, essential hypertension, diabetes, dyslipidemia, Parkinson.  Recently he had CT of his chest done and apparently it showed some calcification of the coronary arteries.  On top of that he also complained of having some exertional chest pain.  He said does not happen too often but when he will work hard he will develop some uneasy sensation in the chest.  This sensation goes away.  He does not have any nitroglycerin but he does take aspirin every single day.  This been going on over the last few months stable pattern no worsening in terms of duration is of reproducing symptoms as well as intensity.  Overall he still trying to be active.  And he is actually very happy to be able to see me.  He recognize me right away to have a nice conversation.  He probably told me that finally quit smoking.  He do vapes right now and I told him he needs to stop this as well but overall I congratulated him for the fact that he quit smoking.  Past Medical History:  Diagnosis Date  . Anxiety disorder 08/31/2015  . Asthma 08/31/2015  . BPH without urinary obstruction 08/31/2015  . Callus of foot 12/11/2015  . Colon polyps 09/03/2015   Overview:  [place]. 2017. Polyps.  Q 3 yrs.  Marland Kitchen COPD (chronic obstructive pulmonary disease) with chronic bronchitis ([place]) 08/31/2015   Overview:   Wears O2 at night.  . Coronary artery disease involving native coronary artery of native heart without angina pectoris 04/10/2015   Overview:  30% of LAD in 2007  . Coronary artery stenosis 08/31/2015   Overview:  30% LAD in 2007  . Diabetes mellitus type 2, controlled ([place]) 08/31/2015  . Essential hypertension 08/31/2015  . High risk medication use 08/31/2015  . Insomnia 08/31/2015  . Metatarsalgia of right foot 12/11/2015  . Mixed hyperlipidemia 08/31/2015  . Parkinson's disease ([place]) 08/31/2015  . Plantar fasciitis of right foot 03/11/2016  . Primary osteoarthritis involving multiple joints 09/03/2015  . Sleep apnea 08/31/2015  . Smoking 04/10/2015  . Vitamin B12 deficiency 09/03/2015    Past Surgical History:  Procedure Laterality Date  . BACK SURGERY    . CATARACT EXTRACTION    . heel Right   . KNEE SURGERY Right   . SHOULDER SURGERY Left     Current Medications: Current Meds  Medication Sig  . acetaminophen (TYLENOL) 650 MG CR tablet Take 650 mg by mouth.  . ALPRAZolam (XANAX) 1 MG tablet Take one-half to one by mouth 8 hours prn rescue/anxiety.  Marland Kitchen aspirin EC 81 MG tablet Take 81 mg by mouth daily.   . Cyanocobalamin (RA VITAMIN B-12 TR) 1000 MCG TBCR Take by mouth daily.   Marland Kitchen docusate sodium (COLACE) 50 MG capsule Take 50 mg by  mouth as needed for mild constipation.  . furosemide (LASIX) 20 MG tablet Take 20 mg by mouth daily.   . Garlic 123XX123 MG CAPS Take by mouth at bedtime.  Marland Kitchen losartan (COZAAR) 100 MG tablet Take 100 mg by mouth daily.   . OXYGEN 2L qhs  . pravastatin (PRAVACHOL) 20 MG tablet Take 20 mg by mouth daily.   . tamsulosin (FLOMAX) 0.4 MG CAPS capsule 0.4 mg 2 (two) times daily.   . [DISCONTINUED] albuterol (PROVENTIL HFA;VENTOLIN HFA) 108 (90 Base) MCG/ACT inhaler Inhale into the lungs.     Allergies:   Carbidopa-levodopa and Tramadol   Social History   Socioeconomic History  . Marital status: Divorced    Spouse name: Not on file  . Number of children: Not on file    . Years of education: Not on file  . Highest education level: Not on file  Occupational History  . Not on file  Tobacco Use  . Smoking status: Former Smoker    Packs/day: 0.50    Years: 27.00    Pack years: 13.50    Types: Cigarettes  . Smokeless tobacco: Former Network engineer and Sexual Activity  . Alcohol use: Yes    Alcohol/week: 1.0 standard drinks    Types: 1 Cans of beer per week  . Drug use: Never  . Sexual activity: Not Currently    Partners: Female  Other Topics Concern  . Not on file  Social History Narrative  . Not on file   Social Determinants of Health   Financial Resource Strain:   . Difficulty of Paying Living Expenses: Not on file  Food Insecurity:   . Worried About Charity fundraiser in the Last Year: Not on file  . Ran Out of Food in the Last Year: Not on file  Transportation Needs:   . Lack of Transportation (Medical): Not on file  . Lack of Transportation (Non-Medical): Not on file  Physical Activity:   . Days of Exercise per Week: Not on file  . Minutes of Exercise per Session: Not on file  Stress:   . Feeling of Stress : Not on file  Social Connections:   . Frequency of Communication with Friends and Family: Not on file  . Frequency of Social Gatherings with Friends and Family: Not on file  . Attends Religious Services: Not on file  . Active Member of Clubs or Organizations: Not on file  . Attends Archivist Meetings: Not on file  . Marital Status: Not on file     Family History: The patient's family history is not on file. ROS:   Please see the history of present illness.    All 14 point review of systems negative except as described per history of present illness.  EKGs/Labs/Other Studies Reviewed:    The following studies were reviewed today:   EKG:  EKG is  ordered today.  The ekg ordered today demonstrates normal sinus rhythm, normal P interval, some sinus arrhythmia, nonspecific ST segment changes  Recent Labs: No  results found for requested labs within last 8760 hours.  Recent Lipid Panel No results found for: CHOL, TRIG, HDL, CHOLHDL, VLDL, LDLCALC, LDLDIRECT  Physical Exam:    VS:  BP (!) 162/90 (BP Location: Left Arm, Patient Position: Sitting, Cuff Size: Normal)   Pulse 74   Ht 6' (1.829 m) Comment: Per patient  Wt 198 lb (89.8 kg)   SpO2 95%   BMI 26.85 kg/m     Wt  Readings from Last 3 Encounters:  07/06/19 198 lb (89.8 kg)     GEN:  Well nourished, well developed in no acute distress HEENT: Normal NECK: No JVD; No carotid bruits LYMPHATICS: No lymphadenopathy CARDIAC: RRR, no murmurs, no rubs, no gallops RESPIRATORY:  Clear to auscultation without rales, wheezing or rhonchi  ABDOMEN: Soft, non-tender, non-distended MUSCULOSKELETAL:  No edema; No deformity  SKIN: Warm and dry NEUROLOGIC:  Alert and oriented x 3 PSYCHIATRIC:  Normal affect   ASSESSMENT:    1. Coronary artery disease involving native coronary artery of native heart without angina pectoris   2. Essential hypertension   3. Parkinson's disease ([place])   4. Mixed hyperlipidemia    PLAN:    In order of problems listed above:  1. Coronary artery disease with symptoms worrisome for exertional angina.  Stable pattern.  He is taking aspirin I give him prescription for ranolazine 500 mg twice daily as well as nitroglycerin as needed.  I will schedule him for Lexiscan to assess significance of this symptomatology. 2. Essential hypertension blood pressure appears to be elevated today but this is for his visit after not seeing me for so long.  Will recheck next time and act accordingly. 3. Dyslipidemia he is taking statin which I will continue based on results of the stress test will decide about aggressiveness of therapy however in my opinion he need to be on at least a moderate intensity statin. 4. Parkinson's disease stable.   Medication Adjustments/Labs and Tests Ordered: Current medicines are reviewed at length with  the patient today.  Concerns regarding medicines are outlined above.  No orders of the defined types were placed in this encounter.  No orders of the defined types were placed in this encounter.   Signed, Park Liter, MD, [facility] 07/06/2019 2:01 PM    [facility]

## 2019-07-20 ENCOUNTER — Telehealth: Payer: Self-pay | Admitting: *Deleted

## 2019-07-20 NOTE — Telephone Encounter (Signed)
Patient given detailed instructions per Myocardial Perfusion Study Information Sheet for the test on 07/27/2019 at 0800. Patient notified to arrive 15 minutes early and that it is imperative to arrive on time for appointment to keep from having the test rescheduled.  If you need to cancel or reschedule your appointment, please call the office within 24 hours of your appointment. . Patient verbalized understanding.Estrella Alcaraz, Ranae Palms No mychart

## 2019-07-27 ENCOUNTER — Other Ambulatory Visit: Payer: Self-pay

## 2019-07-27 ENCOUNTER — Ambulatory Visit (INDEPENDENT_AMBULATORY_CARE_PROVIDER_SITE_OTHER): Payer: Medicare Other

## 2019-07-27 VITALS — Ht 72.0 in | Wt 198.0 lb

## 2019-07-27 DIAGNOSIS — I10 Essential (primary) hypertension: Secondary | ICD-10-CM | POA: Diagnosis not present

## 2019-07-27 DIAGNOSIS — G20 Parkinson's disease: Secondary | ICD-10-CM | POA: Diagnosis not present

## 2019-07-27 DIAGNOSIS — E782 Mixed hyperlipidemia: Secondary | ICD-10-CM

## 2019-07-27 DIAGNOSIS — J449 Chronic obstructive pulmonary disease, unspecified: Secondary | ICD-10-CM | POA: Diagnosis not present

## 2019-07-27 DIAGNOSIS — I2510 Atherosclerotic heart disease of native coronary artery without angina pectoris: Secondary | ICD-10-CM

## 2019-07-27 LAB — MYOCARDIAL PERFUSION IMAGING
LV dias vol: 86 mL (ref 62–150)
LV sys vol: 45 mL
Peak HR: 93 {beats}/min
Rest HR: 69 {beats}/min
SDS: 1
SRS: 3
SSS: 4
TID: 1.07

## 2019-07-27 MED ORDER — REGADENOSON 0.4 MG/5ML IV SOLN
0.4000 mg | Freq: Once | INTRAVENOUS | Status: AC
  Administered 2019-07-27: 0.4 mg via INTRAVENOUS

## 2019-07-27 MED ORDER — TECHNETIUM TC 99M TETROFOSMIN IV KIT
10.1000 | PACK | Freq: Once | INTRAVENOUS | Status: AC | PRN
Start: 2019-07-27 — End: 2019-07-27
  Administered 2019-07-27: 10.1 via INTRAVENOUS

## 2019-07-27 MED ORDER — TECHNETIUM TC 99M TETROFOSMIN IV KIT
30.0000 | PACK | Freq: Once | INTRAVENOUS | Status: AC | PRN
  Administered 2019-07-27: 30 via INTRAVENOUS

## 2019-08-08 ENCOUNTER — Other Ambulatory Visit: Payer: Self-pay

## 2019-08-08 ENCOUNTER — Ambulatory Visit: Payer: Medicare Other | Admitting: Cardiology

## 2019-08-08 ENCOUNTER — Encounter: Payer: Self-pay | Admitting: Cardiology

## 2019-08-12 ENCOUNTER — Ambulatory Visit: Payer: Medicare Other | Admitting: Cardiology

## 2019-08-16 ENCOUNTER — Ambulatory Visit (INDEPENDENT_AMBULATORY_CARE_PROVIDER_SITE_OTHER): Payer: Medicare Other | Admitting: Cardiology

## 2019-08-16 ENCOUNTER — Encounter: Payer: Self-pay | Admitting: Cardiology

## 2019-08-16 ENCOUNTER — Other Ambulatory Visit: Payer: Self-pay

## 2019-08-16 VITALS — BP 172/95 | HR 93 | Ht 72.0 in | Wt 200.8 lb

## 2019-08-16 DIAGNOSIS — I2510 Atherosclerotic heart disease of native coronary artery without angina pectoris: Secondary | ICD-10-CM

## 2019-08-16 DIAGNOSIS — I6523 Occlusion and stenosis of bilateral carotid arteries: Secondary | ICD-10-CM | POA: Diagnosis not present

## 2019-08-16 DIAGNOSIS — I10 Essential (primary) hypertension: Secondary | ICD-10-CM | POA: Diagnosis not present

## 2019-08-16 MED ORDER — AMLODIPINE BESYLATE 5 MG PO TABS: 5.0000 mg | ORAL_TABLET | Freq: Every day | ORAL | 1 refills | Status: DC

## 2019-08-16 NOTE — Progress Notes (Signed)
Cardiology Office Note:    Date:  08/16/2019   ID:  Allen Barnes, DOB 04-04-1941, MRN [id number]  PCP:  Raina Mina., MD  Cardiologist:  Jenne Campus, MD    Referring MD: Raina Mina., MD   No chief complaint on file.   History of Present Illness:    Allen Barnes is a 79 y.o. male with past medical history significant for cardiac catheterization done in 2007 showing only luminal disease, he does have history of B12 deficiency essential hypertension, diabetes, dyslipidemia, Parkinson.  Recently had a CT done which showed some calcification of the coronary artery and she was referred to [place] for evaluation.  He described to have some atypical chest pain typically not related to exercise. He did have a stress test done which showed no evidence of ischemia only possibility of old myocardial infarction.  He described only one episode of chest pain when he was having stress test.  Otherwise doing well.  He is trying to walk on the regular basis but because of parking son is somewhat difficult.  Past Medical History:  Diagnosis Date  . Anxiety disorder 08/31/2015  . Asthma 08/31/2015  . BPH without urinary obstruction 08/31/2015  . Callus of foot 12/11/2015  . Colon polyps 09/03/2015   Overview:  [place]. 2017. Polyps.  Q 3 yrs.  Marland Kitchen COPD (chronic obstructive pulmonary disease) with chronic bronchitis ([place]) 08/31/2015   Overview:  Wears O2 at night.  . Coronary artery disease involving native coronary artery of native heart without angina pectoris 04/10/2015   Overview:  30% of LAD in 2007  . Coronary artery stenosis 08/31/2015   Overview:  30% LAD in 2007  . Diabetes mellitus type 2, controlled ([place]) 08/31/2015  . Essential hypertension 08/31/2015  . High risk medication use 08/31/2015  . Insomnia 08/31/2015  . Metatarsalgia of right foot 12/11/2015  . Mixed hyperlipidemia 08/31/2015  . Parkinson's disease ([place]) 08/31/2015  . Plantar fasciitis of right foot 03/11/2016  . Primary osteoarthritis  involving multiple joints 09/03/2015  . Sleep apnea 08/31/2015  . Smoking 04/10/2015  . Vitamin B12 deficiency 09/03/2015    Past Surgical History:  Procedure Laterality Date  . BACK SURGERY    . CATARACT EXTRACTION    . heel Right   . KNEE SURGERY Right   . SHOULDER SURGERY Left     Current Medications: Current Meds  Medication Sig  . acetaminophen (TYLENOL) 650 MG CR tablet Take 650 mg by mouth.  . ALPRAZolam (XANAX) 1 MG tablet Take one-half to one by mouth 8 hours prn rescue/anxiety.  Marland Kitchen aspirin EC 81 MG tablet Take 81 mg by mouth daily.   Marland Kitchen BREO ELLIPTA 200-25 MCG/INH AEPB   . Cyanocobalamin (RA VITAMIN B-12 TR) 1000 MCG TBCR Take by mouth daily.   Marland Kitchen docusate sodium (COLACE) 50 MG capsule Take 50 mg by mouth as needed for mild constipation.  . furosemide (LASIX) 20 MG tablet Take 20 mg by mouth daily.   . Garlic 123XX123 MG CAPS Take by mouth at bedtime.  Marland Kitchen losartan (COZAAR) 100 MG tablet Take 100 mg by mouth daily.   . nitroGLYCERIN (NITROSTAT) 0.4 MG SL tablet Place 1 tablet (0.4 mg total) under the tongue every 5 (five) minutes as needed for chest pain.  . OXYGEN 2L qhs  . pravastatin (PRAVACHOL) 20 MG tablet Take 20 mg by mouth daily.   . ranolazine (RANEXA) 500 MG 12 hr tablet Take 1 tablet (500 mg total) by mouth 2 (two) times  daily.  . tamsulosin (FLOMAX) 0.4 MG CAPS capsule 0.4 mg 2 (two) times daily.      Allergies:   Carbidopa-levodopa and Tramadol   Social History   Socioeconomic History  . Marital status: Divorced    Spouse name: Not on file  . Number of children: Not on file  . Years of education: Not on file  . Highest education level: Not on file  Occupational History  . Not on file  Tobacco Use  . Smoking status: Former Smoker    Packs/day: 0.50    Years: 27.00    Pack years: 13.50    Types: Cigarettes  . Smokeless tobacco: Former Network engineer and Sexual Activity  . Alcohol use: Yes    Alcohol/week: 1.0 standard drinks    Types: 1 Cans of beer per  week  . Drug use: Never  . Sexual activity: Not Currently    Partners: Female  Other Topics Concern  . Not on file  Social History Narrative  . Not on file   Social Determinants of Health   Financial Resource Strain:   . Difficulty of Paying Living Expenses: Not on file  Food Insecurity:   . Worried About Charity fundraiser in the Last Year: Not on file  . Ran Out of Food in the Last Year: Not on file  Transportation Needs:   . Lack of Transportation (Medical): Not on file  . Lack of Transportation (Non-Medical): Not on file  Physical Activity:   . Days of Exercise per Week: Not on file  . Minutes of Exercise per Session: Not on file  Stress:   . Feeling of Stress : Not on file  Social Connections:   . Frequency of Communication with Friends and Family: Not on file  . Frequency of Social Gatherings with Friends and Family: Not on file  . Attends Religious Services: Not on file  . Active Member of Clubs or Organizations: Not on file  . Attends Archivist Meetings: Not on file  . Marital Status: Not on file     Family History: The patient's family history is not on file. ROS:   Please see the history of present illness.    All 14 point review of systems negative except as described per history of present illness  EKGs/Labs/Other Studies Reviewed:      Recent Labs: No results found for requested labs within last 8760 hours.  Recent Lipid Panel No results found for: CHOL, TRIG, HDL, CHOLHDL, VLDL, LDLCALC, LDLDIRECT  Physical Exam:    VS:  BP (!) 172/95   Pulse 93   Ht 6' (1.829 m)   Wt 200 lb 12.8 oz (91.1 kg)   SpO2 93%   BMI 27.23 kg/m     Wt Readings from Last 3 Encounters:  08/16/19 200 lb 12.8 oz (91.1 kg)  07/27/19 198 lb (89.8 kg)  07/06/19 198 lb (89.8 kg)     GEN:  Well nourished, well developed in no acute distress HEENT: Normal NECK: No JVD; No carotid bruits LYMPHATICS: No lymphadenopathy CARDIAC: RRR, no murmurs, no rubs, no  gallops RESPIRATORY:  Clear to auscultation without rales, wheezing or rhonchi  ABDOMEN: Soft, non-tender, non-distended MUSCULOSKELETAL:  No edema; No deformity  SKIN: Warm and dry LOWER EXTREMITIES: 1+ swelling NEUROLOGIC:  Alert and oriented x 3 PSYCHIATRIC:  Normal affect   ASSESSMENT:    1. Coronary artery stenosis   2. Atherosclerosis of both carotid arteries   3. Essential hypertension  PLAN:    In order of problems listed above:  1. Coronary artery disease as proven by cardiac catheterization 2007 showing luminal disease, also recent CT showed calcification of course coronary arteries, however stress test showing no evidence of ischemia.  Therefore the key management of this situation is to modify his risk factor for coronary artery disease.  He is taking aspirin which I will continue.  I also gave him ranolazine he thinks it helps him.  He is already on statin which I will continue. 2. Atherosclerosis of carotic arteries.  I will schedule him to have carotic ultrasound. 3. Essential hypertension blood pressure still elevated today.  I will add amlodipine 5 mg to his medical regimen. 4. Swelling of lower extremities noted on physical examination.  Echocardiogram will be done to check left ventricle ejection fraction.  She may require higher dose of diuretic.   Medication Adjustments/Labs and Tests Ordered: Current medicines are reviewed at length with the patient today.  Concerns regarding medicines are outlined above.  No orders of the defined types were placed in this encounter.  Medication changes: No orders of the defined types were placed in this encounter.   Signed, Park Liter, MD, [facility] 08/16/2019 9:57 AM    [place]

## 2019-08-16 NOTE — Addendum Note (Signed)
Addended by: Ashok Norris on: 08/16/2019 10:27 AM   Modules accepted: Orders

## 2019-08-16 NOTE — Patient Instructions (Signed)
Medication Instructions:  Your physician has recommended you make the following change in your medication:   START: amlodipine 5 mg daily   *If you need a refill on your cardiac medications before your next appointment, please call your pharmacy*  Lab Work: None.  If you have labs (blood work) drawn today and your tests are completely normal, you will receive your results only by: Marland Kitchen MyChart Message (if you have MyChart) OR . A paper copy in the mail If you have any lab test that is abnormal or we need to change your treatment, we will call you to review the results.  Testing/Procedures: Your physician has requested that you have an echocardiogram. Echocardiography is a painless test that uses sound waves to create images of your heart. It provides your doctor with information about the size and shape of your heart and how well your heart's chambers and valves are working. This procedure takes approximately one hour. There are no restrictions for this procedure.    Follow-Up: At [facility], you and your health needs are our priority.  As part of our continuing mission to provide you with exceptional heart care, we have created designated Provider Care Teams.  These Care Teams include your primary Cardiologist (physician) and Advanced Practice Providers (APPs -  Physician Assistants and Nurse Practitioners) who all work together to provide you with the care you need, when you need it.  Your next appointment:   2 month(s)  The format for your next appointment:   In Person  Provider:   Jenne Campus, MD  Other Instructions   Echocardiogram An echocardiogram is a procedure that uses painless sound waves (ultrasound) to produce an image of the heart. Images from an echocardiogram can provide important information about:  Signs of coronary artery disease (CAD).  Aneurysm detection. An aneurysm is a weak or damaged part of an artery wall that bulges out from the normal force of  blood pumping through the body.  Heart size and shape. Changes in the size or shape of the heart can be associated with certain conditions, including heart failure, aneurysm, and CAD.  Heart muscle function.  Heart valve function.  Signs of a past heart attack.  Fluid buildup around the heart.  Thickening of the heart muscle.  A tumor or infectious growth around the heart valves. Tell a health care provider about:  Any allergies you have.  All medicines you are taking, including vitamins, herbs, eye drops, creams, and over-the-counter medicines.  Any blood disorders you have.  Any surgeries you have had.  Any medical conditions you have.  Whether you are pregnant or may be pregnant. What are the risks? Generally, this is a safe procedure. However, problems may occur, including:  Allergic reaction to dye (contrast) that may be used during the procedure. What happens before the procedure? No specific preparation is needed. You may eat and drink normally. What happens during the procedure?   An IV tube may be inserted into one of your veins.  You may receive contrast through this tube. A contrast is an injection that improves the quality of the pictures from your heart.  A gel will be applied to your chest.  A wand-like tool (transducer) will be moved over your chest. The gel will help to transmit the sound waves from the transducer.  The sound waves will harmlessly bounce off of your heart to allow the heart images to be captured in real-time motion. The images will be recorded on a computer.  The procedure may vary among health care providers and hospitals. What happens after the procedure?  You may return to your normal, everyday life, including diet, activities, and medicines, unless your health care provider tells you not to do that. Summary  An echocardiogram is a procedure that uses painless sound waves (ultrasound) to produce an image of the heart.  Images  from an echocardiogram can provide important information about the size and shape of your heart, heart muscle function, heart valve function, and fluid buildup around your heart.  You do not need to do anything to prepare before this procedure. You may eat and drink normally.  After the echocardiogram is completed, you may return to your normal, everyday life, unless your health care provider tells you not to do that. This information is not intended to replace advice given to you by your health care provider. Make sure you discuss any questions you have with your health care provider. Document Revised: 10/07/2018 Document Reviewed: 07/19/2016 Elsevier Patient Education  [place].  Amlodipine Oral Tablets What is this medicine? AMLODIPINE (am LOE di peen) is a calcium channel blocker. It relaxes your blood vessels and decreases the amount of work the heart has to do. It treats high blood pressure and/or prevents chest pain (also called angina). This medicine may be used for other purposes; ask your health care provider or pharmacist if you have questions. COMMON BRAND NAME(S): Norvasc What should I tell my health care provider before I take this medicine? They need to know if you have any of these conditions:  heart disease  liver disease  an unusual or allergic reaction to amlodipine, other drugs, foods, dyes, or preservatives  pregnant or trying to get pregnant  breast-feeding How should I use this medicine? Take this drug by mouth. Take it as directed on the prescription label at the same time every day. You can take it with or without food. If it upsets your stomach, take it with food. Keep taking it unless your health care provider tells you to stop. Talk to your health care provider about the use of this drug in children. While it may be prescribed for children as young as 6 for selected conditions, precautions do apply. Overdosage: If you think you have taken too much of  this medicine contact a poison control center or emergency room at once. NOTE: This medicine is only for you. Do not share this medicine with others. What if I miss a dose? If you miss a dose, take it as soon as you can. If it is almost time for your next dose, take only that dose. Do not take double or extra doses. What may interact with this medicine? This medicine may interact with the following medications:  clarithromycin  cyclosporine  diltiazem  itraconazole  simvastatin  tacrolimus This list may not describe all possible interactions. Give your health care provider a list of all the medicines, herbs, non-prescription drugs, or dietary supplements you use. Also tell them if you smoke, drink alcohol, or use illegal drugs. Some items may interact with your medicine. What should I watch for while using this medicine? Visit your health care provider for regular checks on your progress. Check your blood pressure as directed. Ask your health care provider what your blood pressure should be. Also, find out when you should contact him or her. Do not treat yourself for coughs, colds, or pain while you are using this drug without asking your health care provider for advice.  Some drugs may increase your blood pressure. You may get drowsy or dizzy. Do not drive, use machinery, or do anything that needs mental alertness until you know how this drug affects you. Do not stand up or sit up quickly, especially if you are an older patient. This reduces the risk of dizzy or fainting spells. What side effects may I notice from receiving this medicine? Side effects that you should report to your doctor or health care provider as soon as possible:  allergic reactions (skin rash, itching or hives; swelling of the face, lips, or tongue)  heart attack (trouble breathing; pain or tightness in the chest, neck, back or arms; unusually weak or tired)  low blood pressure (dizziness; feeling faint or  lightheaded, falls; unusually weak or tired) Side effects that usually do not require medical attention (report these to your doctor or health care provider if they continue or are bothersome):  facial flushing  nausea  palpitations  stomach pain  sudden weight gain  swelling of the ankles, feet, hands This list may not describe all possible side effects. Call your doctor for medical advice about side effects. You may report side effects to FDA at 1-800-FDA-1088. Where should I keep my medicine? Keep out of the reach of children and pets. Store at room temperature between 59 and 86 degrees F (15 and 30 degrees C). Protect from light and moisture. Keep the container tightly closed. Throw away any unused drug after the expiration date. NOTE: This sheet is a summary. It may not cover all possible information. If you have questions about this medicine, talk to your doctor, pharmacist, or health care provider.  2020 Elsevier/Gold Standard (2019-03-22 19:39:45)

## 2019-09-05 ENCOUNTER — Ambulatory Visit: Payer: Medicare Other | Admitting: Cardiology

## 2019-09-30 ENCOUNTER — Ambulatory Visit (INDEPENDENT_AMBULATORY_CARE_PROVIDER_SITE_OTHER): Payer: Medicare Other

## 2019-09-30 ENCOUNTER — Other Ambulatory Visit: Payer: Self-pay

## 2019-09-30 DIAGNOSIS — I6523 Occlusion and stenosis of bilateral carotid arteries: Secondary | ICD-10-CM

## 2019-09-30 DIAGNOSIS — I10 Essential (primary) hypertension: Secondary | ICD-10-CM

## 2019-09-30 DIAGNOSIS — I2510 Atherosclerotic heart disease of native coronary artery without angina pectoris: Secondary | ICD-10-CM

## 2019-09-30 NOTE — Progress Notes (Signed)
Echocardiogram completed  Jimmy Quran Vasco RDCS, RVT

## 2019-10-05 ENCOUNTER — Telehealth: Payer: Self-pay | Admitting: Emergency Medicine

## 2019-10-05 NOTE — Telephone Encounter (Signed)
Called patient to inform him of his echocardiogram results. During the call patient reports he has been off of ranaolazine and amlodipine for over 1 week because one of them was making his face, eyes and lips swell. I advised him to stay off both. He is not having any swelling symptoms since coming off of them. He plans to follow up with Dr. Agustin Cree next week on April 16th, 2021. Will forward to Dr. Agustin Cree to see if he has any other concerns.

## 2019-10-13 ENCOUNTER — Other Ambulatory Visit: Payer: Self-pay

## 2019-10-14 ENCOUNTER — Other Ambulatory Visit: Payer: Self-pay

## 2019-10-14 ENCOUNTER — Ambulatory Visit (INDEPENDENT_AMBULATORY_CARE_PROVIDER_SITE_OTHER): Payer: Medicare Other | Admitting: Cardiology

## 2019-10-14 ENCOUNTER — Encounter: Payer: Self-pay | Admitting: Cardiology

## 2019-10-14 VITALS — BP 154/64 | HR 80 | Temp 98.9°F | Ht 72.0 in | Wt 191.8 lb

## 2019-10-14 DIAGNOSIS — I2510 Atherosclerotic heart disease of native coronary artery without angina pectoris: Secondary | ICD-10-CM | POA: Diagnosis not present

## 2019-10-14 DIAGNOSIS — F17200 Nicotine dependence, unspecified, uncomplicated: Secondary | ICD-10-CM

## 2019-10-14 DIAGNOSIS — J449 Chronic obstructive pulmonary disease, unspecified: Secondary | ICD-10-CM

## 2019-10-14 DIAGNOSIS — I10 Essential (primary) hypertension: Secondary | ICD-10-CM

## 2019-10-14 DIAGNOSIS — E782 Mixed hyperlipidemia: Secondary | ICD-10-CM

## 2019-10-14 MED ORDER — ISOSORBIDE MONONITRATE ER 30 MG PO TB24: 30.0000 mg | ORAL_TABLET | Freq: Every day | ORAL | 3 refills | Status: AC

## 2019-10-14 NOTE — Patient Instructions (Addendum)
Medication Instructions:  STOP AMLODIPINE STOP RANOLAZINE due to allergic reaction. START IMDUR 30 mg by mouth daily.  *If you need a refill on your cardiac medications before your next appointment, please call your pharmacy*   Lab Work: None ordered today. If you have labs (blood work) drawn today and your tests are completely normal, you will receive your results only by: Marland Kitchen MyChart Message (if you have MyChart) OR . A paper copy in the mail If you have any lab test that is abnormal or we need to change your treatment, we will call you to review the results.   Testing/Procedures: None ordered today.   Follow-Up: At [facility], you and your health needs are our priority.  As part of our continuing mission to provide you with exceptional heart care, we have created designated Provider Care Teams.  These Care Teams include your primary Cardiologist (physician) and Advanced Practice Providers (APPs -  Physician Assistants and Nurse Practitioners) who all work together to provide you with the care you need, when you need it.  We recommend signing up for the patient portal called "MyChart".  Sign up information is provided on this After Visit Summary.  MyChart is used to connect with patients for Virtual Visits (Telemedicine).  Patients are able to view lab/test results, encounter notes, upcoming appointments, etc.  Non-urgent messages can be sent to your provider as well.   To learn more about what you can do with MyChart, go to [email or web address].    Your next appointment:   Tuesday August 10 at 1:00 pm with Dr. Agustin Cree

## 2019-10-14 NOTE — Progress Notes (Signed)
Cardiology Office Note:    Date:  10/14/2019   ID:  [name], DOB 12/19/1940, MRN [id number]  PCP:  Raina Mina., MD  Cardiologist:  Jenne Campus, MD    Referring MD: Raina Mina., MD   No chief complaint on file. M doing fine  History of Present Illness:    Nakita Apo is a 79 y.o. male  with past medical history significant for cardiac catheterization done in 2007 showing only luminal disease, he does have history of B12 deficiency essential hypertension, diabetes, dyslipidemia, Parkinson.  Recently had a CT done which showed some calcification of the coronary artery and she was referred to [place] for evaluation.  He described to have some atypical chest pain typically not related to exercise. He did have a stress test done which showed no evidence of ischemia only possibility of old myocardial infarction.  He described only one episode of chest pain when he was having stress test.  Otherwise doing well.  He is trying to walk on the regular basis Comes to the thoracic follow-up.  Denies have any chest pain tightness squeezing pressure in the chest since the time I seen him last time.  I tried to put him on ranolazine as well as amlodipine, sadly he developed side effects of this which include facial swelling, eye swelling as well as tongue swelling.  Obviously we nebulized his medications anymore.  We talked in length about options for situation like this today.  The key is risk factors modifications  Past Medical History:  Diagnosis Date  . Acquired plantar porokeratosis 11/18/2018  . Anxiety disorder 08/31/2015  . Asthma 08/31/2015  . Atherosclerosis of abdominal aorta ([place]) 02/21/2019   Seen on CT by urologist. 12/2018.  Formatting of this note might be different from the original. Seen on CT by urologist. 12/2018.  Marland Kitchen Atherosclerosis of both carotid arteries 05/18/2018   Right occluded. Left <50% on 04/2018 [place].  Formatting of this note might be different from the original. Right  occluded. Left <50% on 04/2018 [place].  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 Calculus of gallbladder without cholecystitis without obstruction 05/18/2018   Asymptomatic. Seen on CT 04/2018  Formatting of this note might be different from the original. Asymptomatic. Seen on CT 04/2018  . Callus of foot 12/11/2015  . Colon polyps 09/03/2015   Overview:  Misenheimer. 2017. Polyps.  Q 3 yrs.  Marland Kitchen COPD (chronic obstructive pulmonary disease) with chronic bronchitis ([place]) 08/31/2015   Overview:  Wears O2 at night.  . 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 30% LAD in 2007  Rare. Lizbet Cirrincione  . Coronary artery disease involving native coronary artery of native heart without angina pectoris 04/10/2015   Overview:  30% of LAD in 2007  . Coronary artery stenosis 08/31/2015   Overview:  30% LAD in 2007  . Diabetes mellitus type 2, controlled ([place]) 08/31/2015  . Essential hypertension 08/31/2015  . High risk medication use 08/31/2015  . Insomnia 08/31/2015  . Metatarsalgia of right foot 12/11/2015  . Microscopic hematuria 06/02/2019  . Mixed hyperlipidemia 08/31/2015  . Nicotine dependence, uncomplicated [id number]   Overview:  Former smoker.  Uses vaping  Formatting of this note might be different from the original. Former smoker.  Uses vaping  . Pain of right heel 12/02/2018  . Parkinson's disease ([place]) 08/31/2015  . Plantar fasciitis of right foot 03/11/2016  .  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 Primary osteoarthritis involving multiple joints 09/03/2015  . Sleep apnea 08/31/2015  . Smoking 04/10/2015  . Vitamin B12 deficiency 09/03/2015    Past Surgical History:  Procedure Laterality Date  . BACK SURGERY    . CATARACT EXTRACTION    . heel Right   . KNEE SURGERY Right   . SHOULDER SURGERY Left     Current Medications: Current Meds  Medication Sig  . acetaminophen  (TYLENOL) 500 MG tablet Take 500 mg by mouth 2 (two) times daily.  Marland Kitchen ALPRAZolam (XANAX) 1 MG tablet Take one-half to one by mouth 8 hours prn rescue/anxiety.  Marland Kitchen aspirin EC 81 MG tablet Take 81 mg by mouth daily.   Marland Kitchen BREO ELLIPTA 200-25 MCG/INH AEPB   . Cyanocobalamin (RA VITAMIN B-12 TR) 1000 MCG TBCR Take by mouth daily.   Marland Kitchen docusate sodium (COLACE) 50 MG capsule Take 50 mg by mouth as needed for mild constipation.  . Garlic [id number] MG CAPS Take by mouth at bedtime.  Marland Kitchen losartan (COZAAR) 100 MG tablet Take 100 mg by mouth daily.   . OXYGEN 2L qhs  . pravastatin (PRAVACHOL) 20 MG tablet Take 20 mg by mouth daily.   . SYMBICORT 160-4.5 MCG/ACT inhaler   . tamsulosin (FLOMAX) 0.4 MG CAPS capsule 0.4 mg 2 (two) times daily.      Allergies:   Carbidopa-levodopa and Tramadol   Social History   Socioeconomic History  . Marital status: Divorced    Spouse name: Not on file  . Number of children: Not on file  . Years of education: Not on file  . Highest education level: Not on file  Occupational History  . Not on file  Tobacco Use  . Smoking status: Former Smoker    Packs/day: 0.50    Years: 27.00    Pack years: 13.50    Types: Cigarettes  . Smokeless tobacco: Current User  Substance and Sexual Activity  . Alcohol use: Yes    Alcohol/week: 1.0 standard drinks    Types: 1 Cans of beer per week  . Drug use: Never  . Sexual activity: Not Currently    Partners: Female  Other Topics Concern  . Not on file  Social History Narrative  . Not on file   Social Determinants of Health   Financial Resource Strain:   . Difficulty of Paying Living Expenses:   Food Insecurity:   . Worried About Charity fundraiser in the Last Year:   . Arboriculturist in the Last Year:   Transportation Needs:   . Film/video editor (Medical):   Marland Kitchen Lack of Transportation (Non-Medical):   Physical Activity:   . Days of Exercise per Week:   . Minutes of Exercise per Session:   Stress:   . Feeling of  Stress :   Social Connections:   . Frequency of Communication with Friends and Family:   . Frequency of Social Gatherings with Friends and Family:   . Attends Religious Services:   . Active Member of Clubs or Organizations:   . Attends Archivist Meetings:   Marland Kitchen Marital Status:      Family History: The patient's family history is not on file. ROS:   Please see the history of present illness.    All 14 point review of systems negative except as described per history of present illness  EKGs/Labs/Other Studies Reviewed:      Recent Labs: No results  found for requested labs within last 8760 hours.  Recent Lipid Panel No results found for: CHOL, TRIG, HDL, CHOLHDL, VLDL, LDLCALC, LDLDIRECT  Physical Exam:    VS:  BP (!) 154/64   Pulse 80   Temp 98.9 F (37.2 C)   Ht 6' (1.829 m)   Wt 191 lb 12.8 oz (87 kg)   SpO2 94%   BMI 26.01 kg/m     Wt Readings from Last 3 Encounters:  10/14/19 191 lb 12.8 oz (87 kg)  08/16/19 200 lb 12.8 oz (91.1 kg)  07/27/19 198 lb (89.8 kg)     GEN:  Well nourished, well developed in no acute distress HEENT: Normal NECK: No JVD; No carotid bruits LYMPHATICS: No lymphadenopathy CARDIAC: RRR, no murmurs, no rubs, no gallops RESPIRATORY:  Clear to auscultation without rales, wheezing or rhonchi  ABDOMEN: Soft, non-tender, non-distended MUSCULOSKELETAL:  No edema; No deformity  SKIN: Warm and dry LOWER EXTREMITIES: no swelling NEUROLOGIC:  Alert and oriented x 3 PSYCHIATRIC:  Normal affect   ASSESSMENT:    1. Coronary artery disease involving native coronary artery of native heart without angina pectoris   2. Essential hypertension   3. COPD (chronic obstructive pulmonary disease) with chronic bronchitis (HCC)   4. Smoking   5. Mixed hyperlipidemia    PLAN:    In order of problems listed above:  1. Coronary disease, stress test negative.  We will continue conservative approach.  Risk factors modifications. 2. Essential  hypertension blood pressure is still uncontrolled.  I will put him on Imdur 30 mg which hopefully will prevent him from having episode of chest pain as well as help with blood pressure. 3. COPD that he followed by pulmonary. 4. Smoking he does not smoke cigarettes anymore he vapes I told him to quit. 5. Dyslipidemia: I did review K PN last laboratory test I have is from June 01, 2019, at that time his LDL was 69.  We will continue present management which include pravastatin 20.  I told him that ideally we need to increase the dose of statin however he is reluctant to consider this.  We will continue this conversation.   Medication Adjustments/Labs and Tests Ordered: Current medicines are reviewed at length with the patient today.  Concerns regarding medicines are outlined above.  No orders of the defined types were placed in this encounter.  Medication changes: No orders of the defined types were placed in this encounter.   Signed, Park Liter, MD, [facility] 10/14/2019 1:24 PM    [place]

## 2019-11-30 DIAGNOSIS — D494 Neoplasm of unspecified behavior of bladder: Secondary | ICD-10-CM

## 2019-11-30 HISTORY — DX: Neoplasm of unspecified behavior of bladder: D49.4

## 2019-12-05 DIAGNOSIS — R079 Chest pain, unspecified: Secondary | ICD-10-CM

## 2019-12-05 DIAGNOSIS — D696 Thrombocytopenia, unspecified: Secondary | ICD-10-CM | POA: Diagnosis not present

## 2019-12-05 DIAGNOSIS — H6121 Impacted cerumen, right ear: Secondary | ICD-10-CM | POA: Diagnosis not present

## 2019-12-05 DIAGNOSIS — I2510 Atherosclerotic heart disease of native coronary artery without angina pectoris: Secondary | ICD-10-CM | POA: Diagnosis not present

## 2019-12-06 DIAGNOSIS — H6121 Impacted cerumen, right ear: Secondary | ICD-10-CM | POA: Diagnosis not present

## 2019-12-06 DIAGNOSIS — D696 Thrombocytopenia, unspecified: Secondary | ICD-10-CM | POA: Diagnosis not present

## 2019-12-06 DIAGNOSIS — R079 Chest pain, unspecified: Secondary | ICD-10-CM | POA: Diagnosis not present

## 2019-12-06 DIAGNOSIS — I2510 Atherosclerotic heart disease of native coronary artery without angina pectoris: Secondary | ICD-10-CM | POA: Diagnosis not present

## 2019-12-17 ENCOUNTER — Inpatient Hospital Stay (HOSPITAL_COMMUNITY): Payer: Medicare Other

## 2019-12-17 ENCOUNTER — Inpatient Hospital Stay (HOSPITAL_COMMUNITY)
Admission: EM | Admit: 2019-12-17 | Discharge: 2019-12-22 | DRG: 024 | Disposition: A | Discharge status: Skilled Nursing Facility | Payer: Medicare Other | Attending: Neurology | Admitting: Neurology

## 2019-12-17 ENCOUNTER — Encounter (HOSPITAL_COMMUNITY): Payer: Self-pay | Admitting: Student in an Organized Health Care Education/Training Program

## 2019-12-17 ENCOUNTER — Emergency Department (HOSPITAL_COMMUNITY): Payer: Medicare Other

## 2019-12-17 ENCOUNTER — Inpatient Hospital Stay (HOSPITAL_COMMUNITY): Payer: Medicare Other | Admitting: Anesthesiology

## 2019-12-17 ENCOUNTER — Encounter (HOSPITAL_COMMUNITY)
Admission: EM | Disposition: A | Discharge status: Skilled Nursing Facility | Payer: Self-pay | Source: Home / Self Care | Attending: Neurology

## 2019-12-17 DIAGNOSIS — J441 Chronic obstructive pulmonary disease with (acute) exacerbation: Secondary | ICD-10-CM

## 2019-12-17 DIAGNOSIS — I6523 Occlusion and stenosis of bilateral carotid arteries: Secondary | ICD-10-CM | POA: Diagnosis present

## 2019-12-17 DIAGNOSIS — R29810 Facial weakness: Secondary | ICD-10-CM | POA: Diagnosis present

## 2019-12-17 DIAGNOSIS — J449 Chronic obstructive pulmonary disease, unspecified: Secondary | ICD-10-CM | POA: Diagnosis present

## 2019-12-17 DIAGNOSIS — I639 Cerebral infarction, unspecified: Secondary | ICD-10-CM

## 2019-12-17 DIAGNOSIS — Z7951 Long term (current) use of inhaled steroids: Secondary | ICD-10-CM | POA: Diagnosis not present

## 2019-12-17 DIAGNOSIS — Z8249 Family history of ischemic heart disease and other diseases of the circulatory system: Secondary | ICD-10-CM

## 2019-12-17 DIAGNOSIS — Z801 Family history of malignant neoplasm of trachea, bronchus and lung: Secondary | ICD-10-CM | POA: Diagnosis not present

## 2019-12-17 DIAGNOSIS — G8194 Hemiplegia, unspecified affecting left nondominant side: Secondary | ICD-10-CM | POA: Diagnosis present

## 2019-12-17 DIAGNOSIS — G4733 Obstructive sleep apnea (adult) (pediatric): Secondary | ICD-10-CM | POA: Diagnosis present

## 2019-12-17 DIAGNOSIS — I10 Essential (primary) hypertension: Secondary | ICD-10-CM | POA: Diagnosis present

## 2019-12-17 DIAGNOSIS — E119 Type 2 diabetes mellitus without complications: Secondary | ICD-10-CM | POA: Diagnosis present

## 2019-12-17 DIAGNOSIS — E538 Deficiency of other specified B group vitamins: Secondary | ICD-10-CM | POA: Diagnosis present

## 2019-12-17 DIAGNOSIS — F419 Anxiety disorder, unspecified: Secondary | ICD-10-CM | POA: Diagnosis present

## 2019-12-17 DIAGNOSIS — R414 Neurologic neglect syndrome: Secondary | ICD-10-CM | POA: Diagnosis present

## 2019-12-17 DIAGNOSIS — Z7982 Long term (current) use of aspirin: Secondary | ICD-10-CM

## 2019-12-17 DIAGNOSIS — G20 Parkinson's disease: Secondary | ICD-10-CM | POA: Diagnosis present

## 2019-12-17 DIAGNOSIS — R29708 NIHSS score 8: Secondary | ICD-10-CM | POA: Diagnosis present

## 2019-12-17 DIAGNOSIS — I2510 Atherosclerotic heart disease of native coronary artery without angina pectoris: Secondary | ICD-10-CM | POA: Diagnosis present

## 2019-12-17 DIAGNOSIS — H53462 Homonymous bilateral field defects, left side: Secondary | ICD-10-CM | POA: Diagnosis present

## 2019-12-17 DIAGNOSIS — Z9981 Dependence on supplemental oxygen: Secondary | ICD-10-CM

## 2019-12-17 DIAGNOSIS — Z20822 Contact with and (suspected) exposure to covid-19: Secondary | ICD-10-CM | POA: Diagnosis present

## 2019-12-17 DIAGNOSIS — Z01818 Encounter for other preprocedural examination: Secondary | ICD-10-CM | POA: Diagnosis not present

## 2019-12-17 DIAGNOSIS — I63411 Cerebral infarction due to embolism of right middle cerebral artery: Secondary | ICD-10-CM | POA: Diagnosis present

## 2019-12-17 DIAGNOSIS — F17290 Nicotine dependence, other tobacco product, uncomplicated: Secondary | ICD-10-CM | POA: Diagnosis present

## 2019-12-17 DIAGNOSIS — J95821 Acute postprocedural respiratory failure: Secondary | ICD-10-CM | POA: Diagnosis not present

## 2019-12-17 DIAGNOSIS — W19XXXA Unspecified fall, initial encounter: Secondary | ICD-10-CM

## 2019-12-17 DIAGNOSIS — Z79899 Other long term (current) drug therapy: Secondary | ICD-10-CM

## 2019-12-17 DIAGNOSIS — I340 Nonrheumatic mitral (valve) insufficiency: Secondary | ICD-10-CM | POA: Diagnosis not present

## 2019-12-17 HISTORY — PX: RADIOLOGY WITH ANESTHESIA: SHX6223

## 2019-12-17 HISTORY — DX: Cerebral infarction, unspecified: I63.9

## 2019-12-17 HISTORY — PX: IR ANGIO VERTEBRAL SEL SUBCLAVIAN INNOMINATE BILAT MOD SED: IMG5366

## 2019-12-17 HISTORY — PX: IR PERCUTANEOUS ART THROMBECTOMY/INFUSION INTRACRANIAL INC DIAG ANGIO: IMG6087

## 2019-12-17 HISTORY — PX: IR ANGIO INTRA EXTRACRAN SEL INTERNAL CAROTID UNI R MOD SED: IMG5362

## 2019-12-17 HISTORY — PX: IR CT HEAD LTD: IMG2386

## 2019-12-17 LAB — COMPREHENSIVE METABOLIC PANEL
ALT: 15 U/L (ref 0–44)
AST: 21 U/L (ref 15–41)
Albumin: 3.4 g/dL — ABNORMAL LOW (ref 3.5–5.0)
Alkaline Phosphatase: 38 U/L (ref 38–126)
Anion gap: 8 (ref 5–15)
BUN: 20 mg/dL (ref 8–23)
CO2: 24 mmol/L (ref 22–32)
Calcium: 8.4 mg/dL — ABNORMAL LOW (ref 8.9–10.3)
Chloride: 106 mmol/L (ref 98–111)
Creatinine, Ser: 1.21 mg/dL (ref 0.61–1.24)
GFR calc Af Amer: >60 mL/min (ref >60)
GFR calc non Af Amer: 57 mL/min — ABNORMAL LOW (ref >60)
Glucose, Bld: 95 mg/dL (ref 70–99)
Potassium: 4.1 mmol/L (ref 3.5–5.1)
Sodium: 138 mmol/L (ref 135–145)
Total Bilirubin: 1 mg/dL (ref 0.3–1.2)
Total Protein: 5.6 g/dL — ABNORMAL LOW (ref 6.5–8.1)

## 2019-12-17 LAB — DIFFERENTIAL
Abs Immature Granulocytes: 0.04 10*3/uL (ref 0.00–0.07)
Basophils Absolute: 0 10*3/uL (ref 0.0–0.1)
Basophils Relative: 0 %
Eosinophils Absolute: 0.1 10*3/uL (ref 0.0–0.5)
Eosinophils Relative: 1 %
Immature Granulocytes: 1 %
Lymphocytes Relative: 11 %
Lymphs Abs: 0.9 10*3/uL (ref 0.7–4.0)
Monocytes Absolute: 0.8 10*3/uL (ref 0.1–1.0)
Monocytes Relative: 10 %
Neutro Abs: 6.4 10*3/uL (ref 1.7–7.7)
Neutrophils Relative %: 77 %

## 2019-12-17 LAB — CBG MONITORING, ED: Glucose-Capillary: 88 mg/dL (ref 70–99)

## 2019-12-17 LAB — CBC
HCT: 37.9 % — ABNORMAL LOW (ref 39.0–52.0)
Hemoglobin: 12.3 g/dL — ABNORMAL LOW (ref 13.0–17.0)
MCH: 33.8 pg (ref 26.0–34.0)
MCHC: 32.5 g/dL (ref 30.0–36.0)
MCV: 104.1 fL — ABNORMAL HIGH (ref 80.0–100.0)
Platelets: 128 10*3/uL — ABNORMAL LOW (ref 150–400)
RBC: 3.64 MIL/uL — ABNORMAL LOW (ref 4.22–5.81)
RDW: 12.3 % (ref 11.5–15.5)
WBC: 8.2 10*3/uL (ref 4.0–10.5)
nRBC: 0 % (ref 0.0–0.2)

## 2019-12-17 LAB — PROTIME-INR
INR: 1.1 (ref 0.8–1.2)
Prothrombin Time: 13.9 seconds (ref 11.4–15.2)

## 2019-12-17 LAB — MRSA PCR SCREENING: MRSA by PCR: NEGATIVE

## 2019-12-17 LAB — I-STAT CHEM 8, ED
BUN: 21 mg/dL (ref 8–23)
Calcium, Ion: 1.07 mmol/L — ABNORMAL LOW (ref 1.15–1.40)
Chloride: 104 mmol/L (ref 98–111)
Creatinine, Ser: 1.1 mg/dL (ref 0.61–1.24)
Glucose, Bld: 90 mg/dL (ref 70–99)
HCT: 37 % — ABNORMAL LOW (ref 39.0–52.0)
Hemoglobin: 12.6 g/dL — ABNORMAL LOW (ref 13.0–17.0)
Potassium: 3.9 mmol/L (ref 3.5–5.1)
Sodium: 139 mmol/L (ref 135–145)
TCO2: 23 mmol/L (ref 22–32)

## 2019-12-17 LAB — POCT I-STAT 7, (LYTES, BLD GAS, ICA,H+H)
Acid-base deficit: 3 mmol/L — ABNORMAL HIGH (ref 0.0–2.0)
Bicarbonate: 24.3 mmol/L (ref 20.0–28.0)
Calcium, Ion: 1.25 mmol/L (ref 1.15–1.40)
HCT: 42 % (ref 39.0–52.0)
Hemoglobin: 14.3 g/dL (ref 13.0–17.0)
O2 Saturation: 100 %
Patient temperature: 98
Potassium: 4.1 mmol/L (ref 3.5–5.1)
Sodium: 139 mmol/L (ref 135–145)
TCO2: 26 mmol/L (ref 22–32)
pCO2 arterial: 50.8 mmHg — ABNORMAL HIGH (ref 32.0–48.0)
pH, Arterial: 7.286 — ABNORMAL LOW (ref 7.350–7.450)
pO2, Arterial: 301 mmHg — ABNORMAL HIGH (ref 83.0–108.0)

## 2019-12-17 LAB — APTT: aPTT: 31 seconds (ref 24–36)

## 2019-12-17 LAB — SARS CORONAVIRUS 2 BY RT PCR (HOSPITAL ORDER, PERFORMED IN ~~LOC~~ HOSPITAL LAB): SARS Coronavirus 2: NEGATIVE

## 2019-12-17 LAB — GLUCOSE, CAPILLARY
Glucose-Capillary: 95 mg/dL (ref 70–99)
Glucose-Capillary: 86 mg/dL (ref 70–99)

## 2019-12-17 SURGERY — IR WITH ANESTHESIA
Anesthesia: General

## 2019-12-17 MED ORDER — ARTIFICIAL TEARS OPHTHALMIC OINT
TOPICAL_OINTMENT | OPHTHALMIC | Status: DC | PRN
  Administered 2019-12-17: 1 via OPHTHALMIC

## 2019-12-17 MED ORDER — CLOPIDOGREL BISULFATE 300 MG PO TABS
ORAL_TABLET | ORAL | Status: AC
  Filled 2019-12-17: qty 1

## 2019-12-17 MED ORDER — SENNOSIDES-DOCUSATE SODIUM 8.6-50 MG PO TABS: 1.0000 | ORAL_TABLET | Freq: Every evening | ORAL | Status: DC | PRN

## 2019-12-17 MED ORDER — IOHEXOL 350 MG/ML SOLN
100.0000 mL | Freq: Once | INTRAVENOUS | Status: AC | PRN
  Administered 2019-12-17: 100 mL via INTRAVENOUS

## 2019-12-17 MED ORDER — PHENYLEPHRINE HCL (PRESSORS) 10 MG/ML IV SOLN
INTRAVENOUS | Status: DC | PRN
Start: 2019-12-17 — End: 2019-12-17
  Administered 2019-12-17 (×2): 40 ug via INTRAVENOUS

## 2019-12-17 MED ORDER — IPRATROPIUM-ALBUTEROL 0.5-2.5 (3) MG/3ML IN SOLN
3.0000 mL | Freq: Four times a day (QID) | RESPIRATORY_TRACT | Status: DC
  Administered 2019-12-17: 3 mL via RESPIRATORY_TRACT

## 2019-12-17 MED ORDER — LACTATED RINGERS IV SOLN: INTRAVENOUS | Status: DC | PRN

## 2019-12-17 MED ORDER — EPHEDRINE SULFATE 50 MG/ML IJ SOLN
INTRAMUSCULAR | Status: DC | PRN
Start: 2019-12-17 — End: 2019-12-17
  Administered 2019-12-17: 10 mg via INTRAVENOUS

## 2019-12-17 MED ORDER — DOCUSATE SODIUM 50 MG/5ML PO LIQD: 100.0000 mg | Freq: Two times a day (BID) | ORAL | Status: DC

## 2019-12-17 MED ORDER — CLEVIDIPINE BUTYRATE 0.5 MG/ML IV EMUL
INTRAVENOUS | Status: DC | PRN
  Administered 2019-12-17: 2 mg/h via INTRAVENOUS

## 2019-12-17 MED ORDER — PROPOFOL 500 MG/50ML IV EMUL
INTRAVENOUS | Status: DC | PRN
Start: 2019-12-17 — End: 2019-12-17
  Administered 2019-12-17: 50 ug/kg/min via INTRAVENOUS

## 2019-12-17 MED ORDER — NITROGLYCERIN 1 MG/10 ML FOR IR/CATH LAB
INTRA_ARTERIAL | Status: AC
  Filled 2019-12-17: qty 10

## 2019-12-17 MED ORDER — VERAPAMIL HCL 2.5 MG/ML IV SOLN
INTRAVENOUS | Status: AC
  Filled 2019-12-17: qty 2

## 2019-12-17 MED ORDER — ASPIRIN 81 MG PO CHEW
CHEWABLE_TABLET | ORAL | Status: AC
  Filled 2019-12-17: qty 1

## 2019-12-17 MED ORDER — ACETAMINOPHEN 325 MG PO TABS
650.0000 mg | ORAL_TABLET | ORAL | Status: DC | PRN
  Administered 2019-12-18 – 2019-12-21 (×6): 650 mg via ORAL
  Filled 2019-12-17 (×6): qty 2

## 2019-12-17 MED ORDER — FENTANYL CITRATE (PF) 100 MCG/2ML IJ SOLN
25.0000 ug | INTRAMUSCULAR | Status: DC | PRN
  Administered 2019-12-17 – 2019-12-18 (×4): 50 ug via INTRAVENOUS
  Filled 2019-12-17 (×4): qty 2

## 2019-12-17 MED ORDER — ORAL CARE MOUTH RINSE
15.0000 mL | OROMUCOSAL | Status: DC
  Administered 2019-12-17 – 2019-12-18 (×7): 15 mL via OROMUCOSAL

## 2019-12-17 MED ORDER — IOHEXOL 240 MG/ML SOLN
INTRAMUSCULAR | Status: AC
  Filled 2019-12-17: qty 200

## 2019-12-17 MED ORDER — STROKE: EARLY STAGES OF RECOVERY BOOK
Freq: Once | Status: DC
  Filled 2019-12-17: qty 1

## 2019-12-17 MED ORDER — EPTIFIBATIDE 20 MG/10ML IV SOLN
INTRAVENOUS | Status: AC
  Filled 2019-12-17: qty 10

## 2019-12-17 MED ORDER — ACETAMINOPHEN 650 MG RE SUPP: 650.0000 mg | RECTAL | Status: DC | PRN

## 2019-12-17 MED ORDER — INSULIN ASPART 100 UNIT/ML ~~LOC~~ SOLN
0.0000 [IU] | SUBCUTANEOUS | Status: DC
  Administered 2019-12-19 (×2): 2 [IU] via SUBCUTANEOUS
  Administered 2019-12-20 – 2019-12-22 (×2): 3 [IU] via SUBCUTANEOUS

## 2019-12-17 MED ORDER — FENTANYL CITRATE (PF) 100 MCG/2ML IJ SOLN: 25.0000 ug | INTRAMUSCULAR | Status: DC | PRN

## 2019-12-17 MED ORDER — IPRATROPIUM-ALBUTEROL 0.5-2.5 (3) MG/3ML IN SOLN
3.0000 mL | Freq: Three times a day (TID) | RESPIRATORY_TRACT | Status: DC
  Administered 2019-12-18 – 2019-12-20 (×9): 3 mL via RESPIRATORY_TRACT
  Filled 2019-12-17 (×10): qty 3

## 2019-12-17 MED ORDER — SODIUM CHLORIDE 0.9 % IV SOLN: INTRAVENOUS | Status: DC | PRN

## 2019-12-17 MED ORDER — FENTANYL CITRATE (PF) 100 MCG/2ML IJ SOLN
INTRAMUSCULAR | Status: DC | PRN
  Administered 2019-12-17 (×3): 25 ug via INTRAVENOUS
  Administered 2019-12-17: 50 ug via INTRAVENOUS
  Administered 2019-12-17: 25 ug via INTRAVENOUS
  Administered 2019-12-17: 50 ug via INTRAVENOUS

## 2019-12-17 MED ORDER — SUCCINYLCHOLINE CHLORIDE 20 MG/ML IJ SOLN
INTRAMUSCULAR | Status: DC | PRN
  Administered 2019-12-17: 100 mg via INTRAVENOUS

## 2019-12-17 MED ORDER — GLYCOPYRROLATE 0.2 MG/ML IJ SOLN
INTRAMUSCULAR | Status: DC | PRN
Start: 2019-12-17 — End: 2019-12-17
  Administered 2019-12-17: .2 mg via INTRAVENOUS

## 2019-12-17 MED ORDER — CLEVIDIPINE BUTYRATE 0.5 MG/ML IV EMUL
0.0000 mg/h | INTRAVENOUS | Status: DC
  Administered 2019-12-18: 4 mg/h via INTRAVENOUS
  Administered 2019-12-18: 3 mg/h via INTRAVENOUS
  Administered 2019-12-18: 4 mg/h via INTRAVENOUS
  Filled 2019-12-17 (×3): qty 50

## 2019-12-17 MED ORDER — TAMSULOSIN HCL 0.4 MG PO CAPS
0.4000 mg | ORAL_CAPSULE | Freq: Two times a day (BID) | ORAL | Status: DC
  Administered 2019-12-18 – 2019-12-22 (×8): 0.4 mg via ORAL
  Filled 2019-12-17 (×8): qty 1

## 2019-12-17 MED ORDER — IOHEXOL 240 MG/ML SOLN
150.0000 mL | Freq: Once | INTRAMUSCULAR | Status: AC | PRN
  Administered 2019-12-17: 62 mL via INTRA_ARTERIAL

## 2019-12-17 MED ORDER — CHLORHEXIDINE GLUCONATE 0.12% ORAL RINSE (MEDLINE KIT)
15.0000 mL | Freq: Two times a day (BID) | OROMUCOSAL | Status: DC
  Administered 2019-12-17 – 2019-12-18 (×2): 15 mL via OROMUCOSAL

## 2019-12-17 MED ORDER — SODIUM CHLORIDE 0.9% FLUSH: 3.0000 mL | Freq: Once | INTRAVENOUS | Status: DC

## 2019-12-17 MED ORDER — ACETAMINOPHEN 160 MG/5ML PO SOLN: 650.0000 mg | ORAL | Status: DC | PRN

## 2019-12-17 MED ORDER — POLYETHYLENE GLYCOL 3350 17 G PO PACK: 17.0000 g | PACK | Freq: Every day | ORAL | Status: DC

## 2019-12-17 MED ORDER — FENTANYL CITRATE (PF) 100 MCG/2ML IJ SOLN
INTRAMUSCULAR | Status: AC
  Filled 2019-12-17: qty 2

## 2019-12-17 MED ORDER — PROPOFOL 1000 MG/100ML IV EMUL
0.0000 ug/kg/min | INTRAVENOUS | Status: DC
  Administered 2019-12-17: 40 ug/kg/min via INTRAVENOUS
  Administered 2019-12-18: 50 ug/kg/min via INTRAVENOUS
  Administered 2019-12-18: 40 ug/kg/min via INTRAVENOUS
  Filled 2019-12-17 (×3): qty 100

## 2019-12-17 MED ORDER — TICAGRELOR 90 MG PO TABS
ORAL_TABLET | ORAL | Status: AC
  Filled 2019-12-17: qty 2

## 2019-12-17 MED ORDER — CLEVIDIPINE BUTYRATE 0.5 MG/ML IV EMUL
INTRAVENOUS | Status: AC
  Filled 2019-12-17: qty 50

## 2019-12-17 MED ORDER — SODIUM CHLORIDE 0.9 % IV SOLN: INTRAVENOUS | Status: DC

## 2019-12-17 MED ORDER — PANTOPRAZOLE SODIUM 40 MG IV SOLR
40.0000 mg | INTRAVENOUS | Status: DC
  Administered 2019-12-17: 40 mg via INTRAVENOUS
  Filled 2019-12-17: qty 40

## 2019-12-17 MED ORDER — PROPOFOL 10 MG/ML IV BOLUS
INTRAVENOUS | Status: DC | PRN
  Administered 2019-12-17: 130 mg via INTRAVENOUS

## 2019-12-17 MED ORDER — CHLORHEXIDINE GLUCONATE CLOTH 2 % EX PADS
6.0000 | MEDICATED_PAD | Freq: Every day | CUTANEOUS | Status: DC
  Administered 2019-12-17 – 2019-12-22 (×6): 6 via TOPICAL

## 2019-12-17 MED ORDER — IOHEXOL 300 MG/ML  SOLN
150.0000 mL | Freq: Once | INTRAMUSCULAR | Status: AC | PRN
  Administered 2019-12-17: 100 mL via INTRA_ARTERIAL

## 2019-12-17 MED ORDER — TIROFIBAN HCL IN NACL 5-0.9 MG/100ML-% IV SOLN
INTRAVENOUS | Status: AC
  Filled 2019-12-17: qty 100

## 2019-12-17 MED ORDER — ROCURONIUM BROMIDE 10 MG/ML (PF) SYRINGE
PREFILLED_SYRINGE | INTRAVENOUS | Status: DC | PRN
  Administered 2019-12-17: 10 mg via INTRAVENOUS
  Administered 2019-12-17: 50 mg via INTRAVENOUS
  Administered 2019-12-17 (×2): 10 mg via INTRAVENOUS
  Administered 2019-12-17: 20 mg via INTRAVENOUS
  Administered 2019-12-17 (×2): 10 mg via INTRAVENOUS
  Administered 2019-12-17: 20 mg via INTRAVENOUS
  Administered 2019-12-17: 10 mg via INTRAVENOUS

## 2019-12-17 NOTE — H&P (Addendum)
NEURO HOSPITALIST  H&P   Requesting Physician: Dr. Rex Kras    Chief Complaint:  Right gaze, left sided facial droop  History obtained from:  Patient   HPI:                                                                                                                                         Allen Barnes is an 79 y.o. male  With PMH parkinson's disease, HTN, COPD ( baseline 2L O2), DM2, CAD who presented to [facility] ED as a code stroke for c/o left facial droop right gaze.  Patient wasLKW at 0800 when he fell and hit the back of his head. He was a little dizzy afterwards but no LOC. At 0900 he became dizzy again with some facial droop and EMS was called. Denies CP, blurred vision, LOC.  2nd COVID vaccine dose 08/2019.  On Thursday, had a transurethral procedure for hematuria-questionable mass in the bladder.  Done at [place].  No records to review.  ED course:  BP: 146/88 BG: 88 CTH: no hemorrhage; 1. Acute nonhemorrhagic infarct involving the right lentiform nucleus, crossing the internal capsule and portions of the right caudate head. 2. Acute nonhemorrhagic infarct involving the posterior right insular ribbon. Infarct may involve portions of the temporal lobe. 3. ASPECTS is 6/10.  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 11/29/12 pre-op surgical clearance note from [facility]. CTP: mismatch: 157 ml  Date last known well 12/17/19:0800 tPA Given: no; outside of window Modified Rankin: Rankin Score=1 NIHSS:8 Significant delays in IV access-upwards of 35 to 40 minutes   Past Medical History:  Diagnosis Date  . Acquired plantar porokeratosis 11/18/2018  . Anxiety disorder 08/31/2015  . Asthma 08/31/2015  . Atherosclerosis of abdominal aorta ([place]) 02/21/2019   Seen on CT by urologist. 12/2018.  Formatting of this note might be different from the original. Seen on CT by  urologist. 12/2018.  Marland Kitchen Atherosclerosis of both carotid arteries 05/18/2018   Right occluded. Left <50% on 04/2018 [place].  Formatting of this note might be different from the original. Right occluded. Left <50% on 04/2018 [place].  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 Calculus of gallbladder without cholecystitis without obstruction 05/18/2018   Asymptomatic. Seen on CT 04/2018  Formatting of this note might be different from the original. Asymptomatic. Seen on CT 04/2018  . Callus of foot 12/11/2015  . Colon polyps 09/03/2015   Overview:  Misenheimer. 2017. Polyps.  Q 3 yrs.  Marland Kitchen COPD (chronic obstructive pulmonary disease) with chronic bronchitis ([place]) 08/31/2015  Overview:  Wears O2 at night.  . 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 30% LAD in 2007  Rare. Krasowski  . Coronary artery disease involving native coronary artery of native heart without angina pectoris 04/10/2015   Overview:  30% of LAD in 2007  . Coronary artery stenosis 08/31/2015   Overview:  30% LAD in 2007  . Diabetes mellitus type 2, controlled ([place]) 08/31/2015  . Essential hypertension 08/31/2015  . High risk medication use 08/31/2015  . Insomnia 08/31/2015  . Metatarsalgia of right foot 12/11/2015  . Microscopic hematuria 06/02/2019  . Mixed hyperlipidemia 08/31/2015  . Nicotine dependence, uncomplicated 6/73/4193   Overview:  Former smoker.  Uses vaping  Formatting of this note might be different from the original. Former smoker.  Uses vaping  . Pain of right heel 12/02/2018  . Parkinson's disease ([place]) 08/31/2015  . Plantar fasciitis of right foot 03/11/2016  . 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 Primary osteoarthritis involving multiple joints 09/03/2015  . Sleep apnea 08/31/2015  . Smoking 04/10/2015  . Vitamin B12 deficiency 09/03/2015    Past Surgical History:  Procedure Laterality Date  .  BACK SURGERY    . CATARACT EXTRACTION    . heel Right   . KNEE SURGERY Right   . SHOULDER SURGERY Left     No family history on file.       Social History:  reports that he has quit smoking. His smoking use included cigarettes. He has a 13.50 pack-year smoking history. He uses smokeless tobacco. He reports current alcohol use of about 1.0 standard drink of alcohol per week. He reports that he does not use drugs.  Allergies:  Allergies  Allergen Reactions  . Carbidopa-Levodopa Nausea And Vomiting    Mainly vomiting  . Tramadol Rash    Medications:                                                                                                                           Current Facility-Administered Medications  Medication Dose Route Frequency Provider Last Rate Last Admin  . sodium chloride flush (NS) 0.9 % injection 3 mL  3 mL Intravenous Once Little, Wenda Overland, MD       Current Outpatient Medications  Medication Sig Dispense Refill  . acetaminophen (TYLENOL) 500 MG tablet Take 500 mg by mouth 2 (two) times daily.    Marland Kitchen ALPRAZolam (XANAX) 1 MG tablet Take one-half to one by mouth 8 hours prn rescue/anxiety.    Marland Kitchen aspirin EC 81 MG tablet Take 81 mg by mouth daily.     Marland Kitchen BREO ELLIPTA 200-25 MCG/INH AEPB     . Cyanocobalamin (RA VITAMIN B-12 TR) 1000 MCG TBCR Take by mouth daily.     Marland Kitchen docusate sodium (COLACE) 50 MG capsule Take 50 mg by mouth as needed for mild constipation.    Marland Kitchen  Garlic 6063 MG CAPS Take by mouth at bedtime.    . isosorbide mononitrate (IMDUR) 30 MG 24 hr tablet Take 1 tablet (30 mg total) by mouth daily. 90 tablet 3  . losartan (COZAAR) 100 MG tablet Take 100 mg by mouth daily.     . nitroGLYCERIN (NITROSTAT) 0.4 MG SL tablet Place 1 tablet (0.4 mg total) under the tongue every 5 (five) minutes as needed for chest pain. 25 tablet 1  . OXYGEN 2L qhs    . pravastatin (PRAVACHOL) 20 MG tablet Take 20 mg by mouth daily.     . SYMBICORT 160-4.5 MCG/ACT inhaler     .  tamsulosin (FLOMAX) 0.4 MG CAPS capsule 0.4 mg 2 (two) times daily.        ROS:                                                                                                                                       ROS was performed and is negative except as noted in HPI    General Examination:                                                                                                      Blood pressure (!) 152/83, pulse 60, temperature 98 F (36.7 C), temperature source Oral, resp. rate 18, SpO2 100 %.  Physical Exam  Constitutional: Appears well-developed and well-nourished.  Psych: Affect appropriate to situation Eyes: Normal external eye and conjunctiva. HENT: Normocephalic, no lesions, without obvious abnormality.   Musculoskeletal-no joint tenderness, deformity or swelling Cardiovascular: Normal rate and regular rhythm.  Respiratory: Effort normal, non-labored breathing saturations WNL GI: Soft.  No distension. There is no tenderness.  Skin: WDI  Neurological Examination Mental Status: Alert, oriented, thought content appropriate.  Mild dysarthria. Speech fluent without evidence of aphasia.  Able to follow  commands without difficulty. Cranial Nerves: II:  Left hemaniopsia III,IV, VI: ptosis not present, right gaze preference, but able to cross midline.pupils equal, round, reactive to light and accommodation V,VII: smile asymmetric, left facial droop,  facial light touch sensation normal bilaterally VIII: hearing normal bilaterally IX,X: uvula rises midline XI: bilateral shoulder shrug XII: midline tongue extension Motor: Right : Upper extremity   5/5 Left:     Upper extremity   5/5  Lower extremity   5/5  Lower extremity   5/5 Tone and bulk:normal tone throughout; no atrophy noted Sensory: decreased sensation left side Cerebellar: No ataxia Gait: deferred   Lab Results:  Basic Metabolic Panel: Recent Labs  Lab 12/17/19 1352  NA 139  K 3.9  CL 104  GLUCOSE  90  BUN 21  CREATININE 1.10    CBC: Recent Labs  Lab 12/17/19 1352  HGB 12.6*  HCT 37.0*    CBG: Recent Labs  Lab 12/17/19 1347  GLUCAP 88    Imaging: CT HEAD CODE STROKE WO CONTRAST  Result Date: 12/17/2019 CLINICAL DATA:  Code stroke. Left facial droop. Right-sided gaze. Last seen normal at 9 a.m. EXAM: CT HEAD WITHOUT CONTRAST TECHNIQUE: Contiguous axial images were obtained from the base of the skull through the vertex without intravenous contrast. COMPARISON:  CT head without contrast 12/05/2019 at [facility]. FINDINGS: Brain: Acute nonhemorrhagic infarct is present in the right lentiform nucleus, crossing the internal capsule and involving the right caudate head. Infarct involves the posterior left scratched at the infarct involves posterior right lentiform nucleus and portions of the right temporal lobe. Anterior insular cortex is intact. No additional cortical abnormalities are present over the convexity. Left basal ganglia is within normal limits. Moderate white matter changes are present bilaterally. No acute hemorrhage or mass lesion is present. The ventricles are of normal size. The brainstem and cerebellum are within normal limits. No significant extraaxial fluid collection is present. Vascular: Atherosclerotic calcifications are present within the cavernous internal carotid arteries bilaterally. No hyperdense vessel is present. Skull: Calvarium is intact. No focal lytic or blastic lesions are present. No significant extracranial soft tissue lesion is present. Sinuses/Orbits: Benign chondroid lesion or ostium of the right frontal sinus is stable. The paranasal sinuses and mastoid air cells are otherwise clear. Bilateral lens replacements are noted. Globes and orbits are otherwise unremarkable. ASPECTS [facility] Stroke Program Early CT Score) - Ganglionic level infarction (caudate, lentiform nuclei, internal capsule, insula, M1-M3 cortex): 3/7 - Supraganglionic infarction  (M4-M6 cortex): 3/3 Total score (0-10 with 10 being normal): 6/10 IMPRESSION: 1. Acute nonhemorrhagic infarct involving the right lentiform nucleus, crossing the internal capsule and portions of the right caudate head. 2. Acute nonhemorrhagic infarct involving the posterior right insular ribbon. Infarct may involve portions of the temporal lobe. 3. ASPECTS is 6/10. 4. Stable moderate white matter disease. The above was relayed via text pager to Dr. Rory Percy on 12/17/2019 at 14:06 . Electronically Signed   By: San Morelle M.D.   On: 12/17/2019 14:08       Laurey Morale, MSN, NP-C [facility] [phone]  12/17/2019, 1:55 PM   Attending physician note to follow with Assessment and plan .   Assessment: 79 y.o. male  With PMH parkinson's disease, HTN, COPD ( baseline 2L O2), DM2, CAD who presented to [facility] ED as a code stroke for c/o left facial droop right gaze. CTA showed right M1 occlusion. CTP. Core off 38 ml mismatch 157 . Patient was not a candidate for TPA d/t presenting outside the window. Decision was made to take patient to IR for intervention. Patient was consented.  Procedure was also discussed with cousin Lonny Prude over the phone who also agreed with the procedure.  Likely atheroembolic etiology.  Stroke Risk Factors - diabetes mellitus and hypertension    Plan -- BP goal : per Neuro IR --MRI Brain   --Echocardiogram -- Prophylactic therapy-Antiplatelet med -- High intensity Statin if LDL > 70 -- HgbA1c, fasting lipid panel -- PT consult, OT consult, Speech consult --Telemetry monitoring --Frequent neuro checks --Stroke swallow screen   HTN: Per neuro IR - monitor  Urinary retention s/p bladder tumor resection: keep  foley in place Urology consult check urinalysis  COPD: Close monitoring of respiratory status if remains intubated-would appreciate PCCM consult.  DM:  Hgba1c  Code status: FULL DVT prophylaxis: SCD's only  Disposition:  TBD --Please page the Stroke team from 8am-4pm.   You can look them up on [email or web address]     Present on arrival: -Acute ischemic stroke -hemiplegia/hemiparesis -Foley catheter   Attending addendum I have seen and examined the patient as an acute code stroke. Brought in by [facility] for sudden onset of dizziness and a fall where he hit his head but did not lose consciousness.  Later on, felt weak and called EMS. EMS noted left-sided facial droop and left-sided weakness.  Also noted right-sided gaze preference. Code stroke was activated. He was seen and examined at the bridge. Reports a recent transurethral procedure. Has Foley catheter in place since then. Not on anticoagulants  Examined in the emergency room: Mild left hemiparesis, left-sided neglect, left facial droop, right gaze preference with ability to cross the midline.  NIH stroke scale 8.  Taken for stat head CT which was remarkable for an evolving right hemispheric stroke with an aspects of 6 or 7, no bleed.  Significant delays in obtaining IV access before vascular imaging could be performed.  CTA head and neck also reviewed by me, also with the neuroradiologist-right ICA and right MCA occlusion. CT perfusion:CBF (<30%) Volume: 72mL; Perfusion (Tmax>6.0s) volume: 123mL; Mismatch Volume: 121mL  discussed with on-call neuroradiologist: Dr. Ladean Raya: And decision made to pursue with mechanical thrombectomy.  Consent was obtained from the patient.  Also spoke with patient's cousin Lonny Prude, whose number was provided by the EMS.  She also agreed to proceed with the endovascular thrombectomy.  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  Stroke work-up as above post thrombectomy care as above  -- Amie Portland, MD [facility] Pager: [phone] If 7pm to 7am, please call on call as listed on AMION.   CRITICAL CARE ATTESTATION Performed by: Amie Portland,  MD Total critical care time: 70 minutes Critical care time was exclusive of separately billable procedures and treating other patients and/or supervising APPs/Residents/Students Critical care was necessary to treat or prevent imminent or life-threatening deterioration due to acute ischemic stroke. This patient is critically ill and at significant risk for neurological worsening and/or death and care requires constant monitoring. Critical care was time spent personally by me on the following activities: development of treatment plan with patient and/or surrogate as well as nursing, discussions with consultants, evaluation of patient's response to treatment, examination of patient, obtaining history from patient or surrogate, ordering and performing treatments and interventions, ordering and review of laboratory studies, ordering and review of radiographic studies, pulse oximetry, re-evaluation of patient's condition, participation in multidisciplinary rounds and medical decision making of high complexity in the care of this patient.

## 2019-12-17 NOTE — Consult Note (Signed)
NAME:  Allen Barnes, MRN:  [id number], DOB:  [birth year], LOS: 0 ADMISSION DATE:  12/17/2019, CONSULTATION DATE:  12/17/19 REFERRING MD:  Rory Percy, CHIEF COMPLAINT:   Acute CVA  Brief History   79 year old male heel had a syncopal episode around 8 AM on 6/19 when he had residual dizziness and left-sided facial droop.  Found to have acute occlusion of the right carotid and right M1.  Taken for thrombectomy and transferred to the intensive care unit intubated.  History of present illness   Caillou Minus is a 79 year old male with a past medical history of Parkinson disease, type 2 diabetes, CAD, COPD on 2 L home O2 and bladder mass s/p recent TURP  who was in his usual state of health until he felt dizzy and had a syncopal episode around 8 AM on 6/19, later had left facial droop left upper extremity weakness so was brought to the emergency department by EMS.  In the ED, initial blood pressure was 152/83 and head CT positive for acute CVA in the right lentiform nucleus and posterior right insular ribbon.   CTA with R M1 LVO.  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 (08/31/2015), Coronary artery disease involving native coronary artery of native heart without angina pectoris (04/10/2015), Coronary artery stenosis (08/31/2015), Diabetes mellitus type 2, controlled ([place]) (08/31/2015), Essential hypertension (08/31/2015),  High risk medication use (08/31/2015), Insomnia (08/31/2015), Metatarsalgia of right foot (12/11/2015), Microscopic hematuria (06/02/2019), Mixed hyperlipidemia (08/31/2015), Nicotine dependence, uncomplicated (7/32/2025), Pain of right heel (12/02/2018), Parkinson's disease ([place]) (08/31/2015), Plantar fasciitis of right foot (03/11/2016), 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   Significant Hospital Events   12/17/19 Admit to Neurology, transferred to ICU post revascularization  Consults:  PCCM  Procedures:  12/17/19 ETT 12/17/19 IR PERCUTANEOUS ART THROMBECTOMY  Significant Diagnostic Tests:  6/19 CT head>>Acute infarct involving the right lentiform nucleus, and posterior right insular ribbon. Infarct may involve portions of the temporal lobe. 6/19 CTA head/neck>> Right MCA territory infarct with core infarct of 38 mL. R M1 large vessel occlusion. 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 6/19 CT C-spine>>no acute fracture  Micro Data:  6/19 Sars-CoV-2>>negative 6/19 MRSA screen>>  Antimicrobials:     Interim history/subjective:  Arrived to the ICU stable on Propofol and Cleviprex  Objective   Blood pressure (!) 152/83, pulse 60, temperature 98 F (36.7 C), temperature source Oral, resp. rate 18, height 6' (1.829 m), SpO2 100 %.    Vent Mode: PRVC FiO2 (%):  [100 %] 100 % Set Rate:  [14 bmp] 14 bmp Vt Set:  [620 mL] 620 mL PEEP:  [5 cmH20] 5 cmH20 Plateau Pressure:  [14 cmH20] 14 cmH20   Intake/Output Summary (Last 24 hours) at 12/17/2019 2053 Last data filed at 12/17/2019 1931 Gross per 24 hour  Intake 1500 ml  Output 1500 ml  Net 0 ml   There were no vitals filed for this visit.  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 S/p thrombectomy that was complicated by heavy calcified plaque of the R carotid bulb. Mechanical thrombectomy performed with one stent retriever pass in the right MCA via transcirculation approach -intubated for procedure and left intubated secondary to COPD and sedation -Continue Fentanyl and Propofol overnight, down-titrate for possible extubation in the AM -Maintain full vent support with SAT/SBT as tolerated -titrate Vent setting to maintain SpO2 greater than or equal to 90%. -HOB elevated 30 degrees. -Plateau pressures less than 30 cm H20.  -Follow chest x-ray, ABG prn.   -Bronchial hygiene and RT/bronchodilator protocol. -MRI, Echo, statin and asa per neurology plan   History of COPD On 2L home O2, chronic findings on CXR,  -Decrease FiO2 as able to maintain O2 sats 88-90% -scheduled duonebs   Type 2 DM -A1c pending -SSI  HTN, CAD On Cardizem for post-procedure BP control -hold home Imdur and Cozaar for now, transition to po BP control as able -Asa, statin and echo pending  Recent TURP -continue foley and Flomax     Best practice:  Diet: N.p.o.  Pain/Anxiety/Delirium protocol (if indicated): Fentanyl, propofol VAP protocol (if indicated): HOB 30 degrees, suction as needed DVT prophylaxis: SCDs GI prophylaxis: Protonix Glucose control: SSI Mobility: Bedrest Code Status: Full code Family Communication: Per primary Disposition: ICU  Labs   CBC: Recent Labs  Lab 12/17/19 1352 12/17/19 1354  WBC  --  8.2  NEUTROABS  --  6.4  HGB 12.6* 12.3*  HCT 37.0* 37.9*  MCV  --   104.1*  PLT  --  128*    Basic Metabolic Panel: Recent Labs  Lab 12/17/19 1352 12/17/19 1354  NA 139 138  K 3.9 4.1  CL 104 106  CO2  --  24  GLUCOSE 90 95  BUN 21 20  CREATININE 1.10 1.21  CALCIUM  --  8.4*   GFR: CrCl cannot be calculated (Unknown ideal weight.). Recent Labs  Lab 12/17/19 1354  WBC 8.2    Liver Function Tests: Recent Labs  Lab 12/17/19 1354  AST 21  ALT 15  ALKPHOS 38  BILITOT 1.0  PROT 5.6*  ALBUMIN 3.4*   No results for input(s): LIPASE, AMYLASE in the last 168 hours. No results for input(s): AMMONIA in the last 168 hours.  ABG    Component Value Date/Time   TCO2 23 12/17/2019 1352     Coagulation Profile: Recent Labs  Lab 12/17/19 1354  INR 1.1    Cardiac Enzymes: No results for input(s): CKTOTAL, CKMB, CKMBINDEX, TROPONINI in the last 168 hours.  HbA1C: No results found for: HGBA1C  CBG: Recent Labs  Lab 12/17/19 1347  GLUCAP 88    Review of Systems:   Unable to obtain secondary to sedation  Past Medical History  He,  has a past medical history of Acquired plantar porokeratosis (11/18/2018), Anxiety disorder (08/31/2015), Asthma (08/31/2015), Atherosclerosis of abdominal aorta ([place]) (02/21/2019), Atherosclerosis of both carotid arteries (05/18/2018), BPH without urinary obstruction (08/31/2015), Calculus of gallbladder without cholecystitis without obstruction (05/18/2018), Callus  of foot (12/11/2015), Colon polyps (09/03/2015), COPD (chronic obstructive pulmonary disease) with chronic bronchitis ([place]) (08/31/2015), 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 (08/31/2015), Coronary artery disease involving native coronary artery of native heart without angina pectoris (04/10/2015), Coronary artery stenosis (08/31/2015), Diabetes mellitus type 2, controlled ([place]) (08/31/2015), Essential hypertension (08/31/2015), High risk medication use (08/31/2015), Insomnia (08/31/2015), Metatarsalgia of right foot  (12/11/2015), Microscopic hematuria (06/02/2019), Mixed hyperlipidemia (08/31/2015), Nicotine dependence, uncomplicated (0/25/4270), Pain of right heel (12/02/2018), Parkinson's disease ([place]) (08/31/2015), Plantar fasciitis of right foot (03/11/2016), 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   Surgical History    Past Surgical History:  Procedure Laterality Date  . BACK SURGERY    . CATARACT EXTRACTION    . heel Right   . KNEE SURGERY Right   . SHOULDER SURGERY Left      Social History   reports that he has quit smoking. His smoking use included cigarettes. He has a 13.50 pack-year smoking history. He uses smokeless tobacco. He reports current alcohol use of about 1.0 standard drink of alcohol per week. He reports that he does not use drugs.   Family History   His family history is not on file.   Allergies Allergies  Allergen Reactions  . Carbidopa-Levodopa Nausea And Vomiting    Mainly vomiting  . Tramadol Rash     Home Medications  Prior to Admission medications   Medication Sig Start Date End Date Taking? Authorizing Provider  acetaminophen (TYLENOL) 500 MG tablet Take 500 mg by mouth 2 (two) times daily.    [provider]  ALPRAZolam Duanne Moron) 1 MG tablet Take one-half to one by mouth 8 hours prn rescue/anxiety. 04/04/16   [provider]  aspirin EC 81 MG tablet Take 81 mg by mouth daily.     [provider]  [place]air Patter 623-76 MCG/INH AEPB  07/04/19   [provider]  Cyanocobalamin (RA VITAMIN B-12 TR) 1000 MCG TBCR Take by mouth daily.     [provider]  docusate sodium (COLACE) 50 MG capsule Take 50 mg by mouth as needed for mild constipation.    [provider]  Garlic 2831 MG CAPS Take by mouth at bedtime.    [provider]  isosorbide mononitrate (IMDUR) 30 MG 24  hr tablet Take 1 tablet (30 mg total) by mouth daily. 10/14/19 01/12/20  Park Liter, MD  losartan (COZAAR) 100 MG tablet Take 100 mg by mouth daily.  04/03/16   [provider]  nitroGLYCERIN (NITROSTAT) 0.4 MG SL tablet Place 1 tablet (0.4 mg total) under the tongue every 5 (five) minutes as needed for chest pain. 07/06/19 10/04/19  Park Liter, MD  OXYGEN 2L qhs    [provider]  pravastatin (PRAVACHOL) 20 MG tablet Take 20 mg by mouth daily.  09/03/15   [provider]  SYMBICORT 160-4.5 MCG/ACT inhaler  10/05/19   [provider]  tamsulosin (FLOMAX) 0.4 MG CAPS capsule 0.4 mg 2 (two) times daily.  03/25/16   [provider]     Critical care time: 55 minutes     CRITICAL CARE Performed by: Otilio Carpen [place]   Total critical care time: 55 minutes  Critical care time was exclusive of separately billable procedures and treating other patients.  Critical care was necessary to treat or prevent imminent or life-threatening deterioration.  Critical  care was time spent personally by me on the following activities: development of treatment plan with patient and/or surrogate as well as nursing, discussions with consultants, evaluation of patient's response to treatment, examination of patient, obtaining history from patient or surrogate, ordering and performing treatments and interventions, ordering and review of laboratory studies, ordering and review of radiographic studies, pulse oximetry and re-evaluation of patient's condition.  Otilio Carpen Lalia [place], PA-C

## 2019-12-17 NOTE — Progress Notes (Addendum)
CT C-spine  IMPRESSION: 1. No acute fracture or traumatic listhesis of the cervical spine. 2. 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 3. Emphysema and aortic atherosclerosis.  Aortic Atherosclerosis (ICD10-I70.0) and Emphysema (ICD10-J43.9).   -- Amie Portland, MD [facility] Pager: [phone] If 7pm to 7am, please call on call as listed on AMION.

## 2019-12-17 NOTE — Progress Notes (Signed)
Discussed arteriogram findings with Dr. Larita Fife Attempts to revascularize the Rt MCA via RICA unsuccesful and unable to navigate the RICA occulsion which is likely chronic. Discussed alternate access - via A.Comm.  Does carry risk of stroke in the contralateral hemisphere but there is no other access to the RMCA. Agree with Dr Larita Fife to pursue alternate approach via the left carotid.  -- Amie Portland, MD [facility] Pager: [phone] If 7pm to 7am, please call on call as listed on AMION.

## 2019-12-17 NOTE — Anesthesia Postprocedure Evaluation (Signed)
Anesthesia Post Note  Patient: Allen Barnes  Procedure(s) Performed: IR WITH ANESTHESIA (N/A )     Patient location during evaluation: ICU Anesthesia Type: General Level of consciousness: sedated and patient remains intubated per anesthesia plan Pain management: pain level controlled Vital Signs Assessment: post-procedure vital signs reviewed and stable Respiratory status: patient on ventilator - see flowsheet for VS and patient remains intubated per anesthesia plan Cardiovascular status: stable (requiring cleviprex) Postop Assessment: no apparent nausea or vomiting Anesthetic complications: no   No complications documented.  Last Vitals:  Vitals:   12/17/19 2200 12/17/19 2300  BP: 106/60 118/64  Pulse: 69 (!) 52  Resp: 14   Temp:    SpO2: 97% 99%    Last Pain:  Vitals:   12/17/19 1345  TempSrc: Oral                 Fatema Rabe,E. Shawn Carattini

## 2019-12-17 NOTE — ED Provider Notes (Signed)
[facility] EMERGENCY DEPARTMENT Provider Note   CSN: [id number] Arrival date & time: 12/17/19  1344     History No chief complaint on file.   Allen Barnes is a 79 y.o. male.  79yo M with extensive past medical history below including COPD, CAD, T2DM, HTN, Parkinson's disease, OSA who presents with strokelike symptoms. Patient reportedly had a fall this morning at 8 AM when he fell back and hit the back of his head, had some dizziness afterwards but no loss of consciousness according to family.  Fall was witnessed.  At 9 AM, they noticed some left-sided facial droop and EMS was called.  EMS noted left facial droop, left upper extremity weakness, and rightward gaze deviation.   LEVEL 5 CAVEAT DUE TO ACUITY OF CONDITION AND AMS  The history is provided by the EMS personnel.       Past Medical History:  Diagnosis Date  . Acquired plantar porokeratosis 11/18/2018  . Anxiety disorder 08/31/2015  . Asthma 08/31/2015  . Atherosclerosis of abdominal aorta ([place]) 02/21/2019   Seen on CT by urologist. 12/2018.  Formatting of this note might be different from the original. Seen on CT by urologist. 12/2018.  Marland Kitchen Atherosclerosis of both carotid arteries 05/18/2018   Right occluded. Left <50% on 04/2018 [place].  Formatting of this note might be different from the original. Right occluded. Left <50% on 04/2018 [place].  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 Calculus of gallbladder without cholecystitis without obstruction 05/18/2018   Asymptomatic. Seen on CT 04/2018  Formatting of this note might be different from the original. Asymptomatic. Seen on CT 04/2018  . Callus of foot 12/11/2015  . Colon polyps 09/03/2015   Overview:  [place]. 2017. Polyps.  Q 3 yrs.  Marland Kitchen COPD (chronic obstructive pulmonary disease) with chronic bronchitis ([place]) 08/31/2015   Overview:  Wears O2 at night.  . 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 30% LAD in 2007  Rare. [place]  . Coronary artery disease involving native coronary artery of native heart without angina pectoris 04/10/2015   Overview:  30% of LAD in 2007  . Coronary artery stenosis 08/31/2015   Overview:  30% LAD in 2007  . Diabetes mellitus type 2, controlled ([place]) 08/31/2015  . Essential hypertension 08/31/2015  . High risk medication use 08/31/2015  . Insomnia 08/31/2015  . Metatarsalgia of right foot 12/11/2015  . Microscopic hematuria 06/02/2019  . Mixed hyperlipidemia 08/31/2015  . Nicotine dependence, uncomplicated 0/62/6948   Overview:  Former smoker.  Uses vaping  Formatting of this note might be different from the original. Former smoker.  Uses vaping  . Pain of right heel 12/02/2018  . Parkinson's disease ([place]) 08/31/2015  . Plantar fasciitis of right foot 03/11/2016  . 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 Primary osteoarthritis involving multiple joints 09/03/2015  . Sleep apnea 08/31/2015  . Smoking 04/10/2015  . Vitamin B12 deficiency 09/03/2015    Patient Active Problem List   Diagnosis Date Noted  . Acute ischemic stroke ([place]) 12/17/2019  . Microscopic hematuria 06/02/2019  . Atherosclerosis of abdominal aorta ([place]) 02/21/2019  . 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 Pain of right heel 12/02/2018  . Acquired plantar porokeratosis 11/18/2018  . Atherosclerosis of both carotid arteries 05/18/2018  . Calculus of gallbladder without cholecystitis without obstruction 05/18/2018  .  Nicotine dependence, uncomplicated 17/49/4496  . Plantar fasciitis of right foot 03/11/2016  . Callus of foot 12/11/2015  . Metatarsalgia of right foot 12/11/2015  . Colon polyps 09/03/2015  . Primary osteoarthritis involving multiple joints 09/03/2015  . Vitamin B12 deficiency 09/03/2015  . Anxiety disorder 08/31/2015  . Asthma 08/31/2015  . BPH without urinary obstruction 08/31/2015  . COPD  (chronic obstructive pulmonary disease) with chronic bronchitis ([place]) 08/31/2015  . Coronary artery stenosis 08/31/2015  . Diabetes mellitus type 2, controlled ([place]) 08/31/2015  . Essential hypertension 08/31/2015  . High risk medication use 08/31/2015  . Insomnia 08/31/2015  . Mixed hyperlipidemia 08/31/2015  . Parkinson's disease ([place]) 08/31/2015  . Sleep apnea 08/31/2015  . Coronary artery disease involving native coronary artery of native heart without angina pectoris 04/10/2015  . Smoking 04/10/2015    Past Surgical History:  Procedure Laterality Date  . BACK SURGERY    . CATARACT EXTRACTION    . heel Right   . KNEE SURGERY Right   . SHOULDER SURGERY Left        No family history on file.  Social History   Tobacco Use  . Smoking status: Former Smoker    Packs/day: 0.50    Years: 27.00    Pack years: 13.50    Types: Cigarettes  . Smokeless tobacco: Current User  Vaping Use  . Vaping Use: Former  Substance Use Topics  . Alcohol use: Yes    Alcohol/week: 1.0 standard drink    Types: 1 Cans of beer per week  . Drug use: Never    Home Medications Prior to Admission medications   Medication Sig Start Date End Date Taking? Authorizing Provider  acetaminophen (TYLENOL) 500 MG tablet Take 500 mg by mouth 2 (two) times daily.    [provider]  ALPRAZolam Duanne Moron) 1 MG tablet Take one-half to one by mouth 8 hours prn rescue/anxiety. 04/04/16   [provider]  aspirin EC 81 MG tablet Take 81 mg by mouth daily.     [provider]  Adair Patter 759-16 MCG/INH AEPB  07/04/19   [provider]  Cyanocobalamin (RA VITAMIN B-12 TR) 1000 MCG TBCR Take by mouth daily.     [provider]  docusate sodium (COLACE) 50 MG capsule Take 50 mg by mouth as needed for mild constipation.    [provider]  Garlic 3846 MG CAPS Take by mouth at bedtime.    [provider]  isosorbide mononitrate (IMDUR) 30 MG 24 hr tablet  Take 1 tablet (30 mg total) by mouth daily. 10/14/19 01/12/20  Park Liter, MD  losartan (COZAAR) 100 MG tablet Take 100 mg by mouth daily.  04/03/16   [provider]  nitroGLYCERIN (NITROSTAT) 0.4 MG SL tablet Place 1 tablet (0.4 mg total) under the tongue every 5 (five) minutes as needed for chest pain. 07/06/19 10/04/19  Park Liter, MD  OXYGEN 2L qhs    [provider]  pravastatin (PRAVACHOL) 20 MG tablet Take 20 mg by mouth daily.  09/03/15   [provider]  SYMBICORT 160-4.5 MCG/ACT inhaler  10/05/19   [provider]  tamsulosin (FLOMAX) 0.4 MG CAPS capsule 0.4 mg 2 (two) times daily.  03/25/16   [provider]    Allergies    Carbidopa-levodopa and Tramadol  Review of Systems   Review of Systems  Unable to perform ROS: Acuity of condition    Physical Exam Updated Vital Signs There were no vitals  taken for this visit.  Physical Exam Vitals and nursing note reviewed.  Constitutional:      General: He is not in acute distress.    Appearance: He is well-developed.  HENT:     Head: Normocephalic and atraumatic.     Nose: Nose normal.  Eyes:     Conjunctiva/sclera: Conjunctivae normal.     Pupils: Pupils are equal, round, and reactive to light.     Comments: Rightward gaze preference  Cardiovascular:     Rate and Rhythm: Normal rate and regular rhythm.  Pulmonary:     Effort: Pulmonary effort is normal.  Abdominal:     General: Abdomen is flat. There is no distension.  Musculoskeletal:     Cervical back: Neck supple.  Skin:    General: Skin is warm and dry.  Neurological:     Mental Status: He is alert.     Comments: L facial droop, rightward gaze preference although can cross midline, mild L arm drift with 4/5 strength compared to R; 5/5 strength BLE     ED Results / Procedures / Treatments   Labs (all labs ordered are listed, but only abnormal results are displayed) Labs Reviewed  CBC - Abnormal; Notable for  the following components:      Result Value   RBC 3.64 (*)    Hemoglobin 12.3 (*)    HCT 37.9 (*)    MCV 104.1 (*)    Platelets 128 (*)    All other components within normal limits  COMPREHENSIVE METABOLIC PANEL - Abnormal; Notable for the following components:   Calcium 8.4 (*)    Total Protein 5.6 (*)    Albumin 3.4 (*)    GFR calc non Af Amer 57 (*)    All other components within normal limits  I-STAT CHEM 8, ED - Abnormal; Notable for the following components:   Calcium, Ion 1.07 (*)    Hemoglobin 12.6 (*)    HCT 37.0 (*)    All other components within normal limits  SARS CORONAVIRUS 2 BY RT PCR (HOSPITAL ORDER, [facility])  PROTIME-INR  APTT  DIFFERENTIAL  CBG MONITORING, ED    EKG None  Radiology CT HEAD CODE STROKE WO CONTRAST  Result Date: 12/17/2019 CLINICAL DATA:  Code stroke. Left facial droop. Right-sided gaze. Last seen normal at 9 a.m. EXAM: CT HEAD WITHOUT CONTRAST TECHNIQUE: Contiguous axial images were obtained from the base of the skull through the vertex without intravenous contrast. COMPARISON:  CT head without contrast 12/05/2019 at [facility]. FINDINGS: Brain: Acute nonhemorrhagic infarct is present in the right lentiform nucleus, crossing the internal capsule and involving the right caudate head. Infarct involves the posterior left scratched at the infarct involves posterior right lentiform nucleus and portions of the right temporal lobe. Anterior insular cortex is intact. No additional cortical abnormalities are present over the convexity. Left basal ganglia is within normal limits. Moderate white matter changes are present bilaterally. No acute hemorrhage or mass lesion is present. The ventricles are of normal size. The brainstem and cerebellum are within normal limits. No significant extraaxial fluid collection is present. Vascular: Atherosclerotic calcifications are present within the cavernous internal carotid arteries  bilaterally. No hyperdense vessel is present. Skull: Calvarium is intact. No focal lytic or blastic lesions are present. No significant extracranial soft tissue lesion is present. Sinuses/Orbits: Benign chondroid lesion or ostium of the right frontal sinus is stable. The paranasal sinuses and mastoid air cells are otherwise clear. Bilateral  lens replacements are noted. Globes and orbits are otherwise unremarkable. ASPECTS [facility] Stroke Program Early CT Score) - Ganglionic level infarction (caudate, lentiform nuclei, internal capsule, insula, M1-M3 cortex): 3/7 - Supraganglionic infarction (M4-M6 cortex): 3/3 Total score (0-10 with 10 being normal): 6/10 IMPRESSION: 1. Acute nonhemorrhagic infarct involving the right lentiform nucleus, crossing the internal capsule and portions of the right caudate head. 2. Acute nonhemorrhagic infarct involving the posterior right insular ribbon. Infarct may involve portions of the temporal lobe. 3. ASPECTS is 6/10. 4. Stable moderate white matter disease. The above was relayed via text pager to Dr. Rory Percy on 12/17/2019 at 14:06 . Electronically Signed   By: San Morelle M.D.   On: 12/17/2019 14:08    Procedures Procedures (including critical care time) CRITICAL CARE Performed by: Wenda Overland Clarisa Danser   Total critical care time: 30 minutes  Critical care time was exclusive of separately billable procedures and treating other patients.  Critical care was necessary to treat or prevent imminent or life-threatening deterioration.  Critical care was time spent personally by me on the following activities: development of treatment plan with patient and/or surrogate as well as nursing, discussions with consultants, evaluation of patient's response to treatment, examination of patient, obtaining history from patient or surrogate, ordering and performing treatments and interventions, ordering and review of laboratory studies, ordering and review of radiographic  studies, pulse oximetry and re-evaluation of patient's condition.  Medications Ordered in ED Medications  sodium chloride flush (NS) 0.9 % injection 3 mL (has no administration in time range)   stroke: mapping our early stages of recovery book (has no administration in time range)  0.9 %  sodium chloride infusion (has no administration in time range)  acetaminophen (TYLENOL) tablet 650 mg (has no administration in time range)    Or  acetaminophen (TYLENOL) 160 MG/5ML solution 650 mg (has no administration in time range)    Or  acetaminophen (TYLENOL) suppository 650 mg (has no administration in time range)  senna-docusate (Senokot-S) tablet 1 tablet (has no administration in time range)  iohexol (OMNIPAQUE) 350 MG/ML injection 100 mL (100 mLs Intravenous Contrast Given 12/17/19 1416)    ED Course  I have reviewed the triage vital signs and the nursing notes.  Pertinent labs & imaging results that were available during my care of the patient were reviewed by me and considered in my medical decision making (see chart for details).    MDM Rules/Calculators/A&P                          Patient arrived as a code stroke and was immediately taken to CT scan where he was met by stroke service, Dr. Rory Percy.  Head CT shows several areas of right acute infarct.  CTA obtained which shows acute occlusion of the right carotid and right M1. Dr. Rory Percy contacted IR and obtained family consent for intervention. Pt taken to IR for possible thrombectomy.  Final Clinical Impression(s) / ED Diagnoses Final diagnoses:  Fall  Stroke (cerebrum) [facility])    Rx / DC Orders ED Discharge Orders    None       Cipriana Biller, Wenda Overland, MD 12/17/19 1534

## 2019-12-17 NOTE — Transfer of Care (Signed)
Immediate Anesthesia Transfer of Care Note  Patient: Allen Barnes  Procedure(s) Performed: IR WITH ANESTHESIA (N/A )  Patient Location: ICU  Anesthesia Type:General  Level of Consciousness: sedated and Patient remains intubated per anesthesia plan  Airway & Oxygen Therapy: Patient remains intubated per anesthesia plan and Patient placed on Ventilator (see vital sign flow sheet for setting)  Post-op Assessment: Report given to RN and Post -op Vital signs reviewed and stable  Post vital signs: Reviewed and stable  Last Vitals:  Vitals Value Taken Time  BP 140/70 12/17/19 2032  Temp    Pulse    Resp 18 12/17/19 2034  SpO2    Vitals shown include unvalidated device data.  Last Pain:  Vitals:   12/17/19 1345  TempSrc: Oral         Complications: No complications documented.

## 2019-12-17 NOTE — Progress Notes (Signed)
[facility] Progress Note Patient Name: Allen Barnes DOB: 08-31-1940 MRN: [id number]   Date of Service  12/17/2019  HPI/Events of Note  6 M COPD on home O2 presented with left facial droop and right gaze. Underwent cerebral angiogram and mechanical thrombectomy via RCFA of RMCA with partial revascularization. Patient kept intubated on transfer to ICU.  eICU Interventions   Post cath care. Neuro and perfusion check q 1  SBT once more awake  Bedside CCM has just evaluated patient     Intervention Category Major Interventions: Respiratory failure - evaluation and management;Other:  Judd Lien 12/17/2019, 9:24 PM

## 2019-12-17 NOTE — Procedures (Signed)
INTERVENTIONAL NEURORADIOLOGY BRIEF POSTPROCEDURE NOTE  DIAGNOSTIC CEREBRAL ANGIOGRAM AND MECHANICAL THROMBECTOMY  Attending: Dr. Pedro Earls  Assistant: None  Diagnosis: Right ICA chronic occlusion; right M1 acute occlusion  Access site: RCFA  Access closure: 56F angioseal  Anesthesia: General  Medication used: general anesthesia  Complications: None  Estimated blood loss: 200 mL  Specimen: None  Findings: Heavily calcified occlusive plaque in the right carotid bulb. Access to the right ICA distal to the occlusion failed after multiple attempts with different wires and catheters. Distal access only attained via false lumen without connection with the true lumen. Mechanical thrombectomy performed with one stent retriever pass in the right MCA via transcirculation approach (left ICA - AComA). Contrast opacification of the right MCA seen via ACom. Evaluation difficult due to faint contrast but penetration of the distal branches is seen (likely TICI2B). Embolus to the distal left A3 noted. Mechanical thrombectomy performed with recanalization up to the A4 segment.   The patient tolerated the procedure well without incident or complication and is in stable condition. However, will remain intubated due to anesthesia concerns about his baseline respiratory status.  Plan: - Bed rest post femoral access 6h - ICU level of care - SBP 140-160 - STAT head CT for any acute neurological deterioration

## 2019-12-17 NOTE — ED Triage Notes (Signed)
Pt BIB [facility] from home. Pt experienced a fall this morning @ 0800. Pt was fine following fall. Pt reports at sometime between 0900-1200 started experiencing lefts sided facial droop and left sided weakness. Pt A&O x4. VSS. NAD.

## 2019-12-17 NOTE — Anesthesia Preprocedure Evaluation (Addendum)
Anesthesia Evaluation  Patient identified by MRN, date of birth, ID band Patient awake    Reviewed: Allergy & Precautions, NPO status , Patient's Chart, lab work & pertinent test results  History of Anesthesia Complications Negative for: history of anesthetic complications  Airway Mallampati: II  TM Distance: >3 FB Neck ROM: Full    Dental  (+) Edentulous Upper, Edentulous Lower   Pulmonary sleep apnea , COPD,  oxygen dependent, former smoker,    breath sounds clear to auscultation       Cardiovascular hypertension, Pt. on medications (-) angina+ CAD   Rhythm:Regular Rate:Normal  09/2019 ECHO: EF 50-55%, valves OK  07/2019 Myoview: Nuclear stress EF: 48%, no ST segment deviation noted during stress, No T wave inversion was noted during stress. There is a small defect of moderate severity present in the basal inferior location. There is mild hypokinesis in the basal inferior wall. Findings consistent with prior myocardial infarction. No evidence of peri-infarct ischemia.  This is an intermediate risk study, due to depressed ejection fraction.      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    Musculoskeletal   Abdominal   Peds  Hematology negative hematology ROS (+)   Anesthesia Other Findings   Reproductive/Obstetrics                            Anesthesia Physical Anesthesia Plan  ASA: IV and emergent  Anesthesia Plan: General   Post-op Pain Management:    Induction: Intravenous  PONV Risk Score and Plan: 2 and Treatment may vary due to age or medical condition  Airway Management Planned: Oral ETT  Additional Equipment: Arterial line  Intra-op Plan:   Post-operative Plan: Extubation in OR  Informed Consent: I have  reviewed the patients History and Physical, chart, labs and discussed the procedure including the risks, benefits and alternatives for the proposed anesthesia with the patient or authorized representative who has indicated his/her understanding and acceptance.       Plan Discussed with: CRNA and Surgeon  Anesthesia Plan Comments:        Anesthesia Quick Evaluation

## 2019-12-17 NOTE — Anesthesia Procedure Notes (Addendum)
Arterial Line Insertion Start/End6/19/2021 3:15 PM, 12/17/2019 3:21 PM Performed by: Milford Cage, CRNA, CRNA  Patient location: OOR procedure area. Preanesthetic checklist: patient identified, IV checked, monitors and equipment checked, pre-op evaluation and timeout performed Emergency situation Patient sedated Left, radial was placed Catheter size: 20 G Hand hygiene performed  and maximum sterile barriers used   Attempts: 2 Procedure performed without using ultrasound guided technique. Following insertion, dressing applied and Biopatch. Post procedure assessment: normal  Patient tolerated the procedure well with no immediate complications.

## 2019-12-17 NOTE — Anesthesia Procedure Notes (Signed)
Procedure Name: Intubation Date/Time: 12/17/2019 3:03 PM Performed by: Suzy Bouchard, CRNA Pre-anesthesia Checklist: Patient identified, Emergency Drugs available, Suction available, Patient being monitored and Timeout performed Patient Re-evaluated:Patient Re-evaluated prior to induction Oxygen Delivery Method: Ambu bag Preoxygenation: Pre-oxygenation with 100% oxygen Induction Type: IV induction, Rapid sequence and Cricoid Pressure applied Laryngoscope Size: Glidescope and 3 Grade View: Grade I Tube type: Oral Tube size: 7.5 mm Number of attempts: 1 Airway Equipment and Method: Stylet and Video-laryngoscopy Placement Confirmation: ETT inserted through vocal cords under direct vision,  CO2 detector and breath sounds checked- equal and bilateral Secured at: 21 cm Tube secured with: Tape Dental Injury: Teeth and Oropharynx as per pre-operative assessment

## 2019-12-18 ENCOUNTER — Inpatient Hospital Stay (HOSPITAL_COMMUNITY): Payer: Medicare Other

## 2019-12-18 ENCOUNTER — Other Ambulatory Visit (HOSPITAL_COMMUNITY): Payer: Medicare Other

## 2019-12-18 DIAGNOSIS — J95821 Acute postprocedural respiratory failure: Secondary | ICD-10-CM

## 2019-12-18 DIAGNOSIS — I340 Nonrheumatic mitral (valve) insufficiency: Secondary | ICD-10-CM

## 2019-12-18 LAB — BASIC METABOLIC PANEL
Anion gap: 8 (ref 5–15)
BUN: 13 mg/dL (ref 8–23)
CO2: 25 mmol/L (ref 22–32)
Calcium: 8.7 mg/dL — ABNORMAL LOW (ref 8.9–10.3)
Chloride: 108 mmol/L (ref 98–111)
Creatinine, Ser: 1.1 mg/dL (ref 0.61–1.24)
GFR calc Af Amer: >60 mL/min (ref >60)
GFR calc non Af Amer: >60 mL/min (ref >60)
Glucose, Bld: 113 mg/dL — ABNORMAL HIGH (ref 70–99)
Potassium: 3.6 mmol/L (ref 3.5–5.1)
Sodium: 141 mmol/L (ref 135–145)

## 2019-12-18 LAB — GLUCOSE, CAPILLARY
Glucose-Capillary: 80 mg/dL (ref 70–99)
Glucose-Capillary: 46 mg/dL — ABNORMAL LOW (ref 70–99)
Glucose-Capillary: 81 mg/dL (ref 70–99)
Glucose-Capillary: 66 mg/dL — ABNORMAL LOW (ref 70–99)
Glucose-Capillary: 91 mg/dL (ref 70–99)
Glucose-Capillary: 85 mg/dL (ref 70–99)
Glucose-Capillary: 98 mg/dL (ref 70–99)
Glucose-Capillary: 54 mg/dL — ABNORMAL LOW (ref 70–99)
Glucose-Capillary: 66 mg/dL — ABNORMAL LOW (ref 70–99)
Glucose-Capillary: 103 mg/dL — ABNORMAL HIGH (ref 70–99)

## 2019-12-18 LAB — ECHOCARDIOGRAM LIMITED
Height: 72 in
Weight: 3097.02 oz

## 2019-12-18 LAB — LIPID PANEL
Cholesterol: 148 mg/dL (ref 0–200)
HDL: 56 mg/dL (ref >40)
LDL Cholesterol: 70 mg/dL (ref 0–99)
Total CHOL/HDL Ratio: 2.6 RATIO
Triglycerides: 111 mg/dL (ref <150)
VLDL: 22 mg/dL (ref 0–40)

## 2019-12-18 LAB — PHOSPHORUS: Phosphorus: 3.2 mg/dL (ref 2.5–4.6)

## 2019-12-18 LAB — MAGNESIUM: Magnesium: 1.7 mg/dL (ref 1.7–2.4)

## 2019-12-18 LAB — HEMOGLOBIN A1C
Hgb A1c MFr Bld: 5.8 % — ABNORMAL HIGH (ref 4.8–5.6)
Mean Plasma Glucose: 119.76 mg/dL

## 2019-12-18 LAB — CBC
HCT: 37.3 % — ABNORMAL LOW (ref 39.0–52.0)
Hemoglobin: 12.4 g/dL — ABNORMAL LOW (ref 13.0–17.0)
MCH: 34.3 pg — ABNORMAL HIGH (ref 26.0–34.0)
MCHC: 33.2 g/dL (ref 30.0–36.0)
MCV: 103.3 fL — ABNORMAL HIGH (ref 80.0–100.0)
Platelets: 129 10*3/uL — ABNORMAL LOW (ref 150–400)
RBC: 3.61 MIL/uL — ABNORMAL LOW (ref 4.22–5.81)
RDW: 12.6 % (ref 11.5–15.5)
WBC: 7.1 10*3/uL (ref 4.0–10.5)
nRBC: 0 % (ref 0.0–0.2)

## 2019-12-18 LAB — TRIGLYCERIDES: Triglycerides: 114 mg/dL (ref <150)

## 2019-12-18 MED ORDER — PRAVASTATIN SODIUM 10 MG PO TABS
20.0000 mg | ORAL_TABLET | Freq: Every day | ORAL | Status: DC
  Administered 2019-12-19 – 2019-12-22 (×4): 20 mg via ORAL
  Filled 2019-12-18 (×4): qty 2

## 2019-12-18 MED ORDER — DEXTROSE 50 % IV SOLN
12.5000 g | INTRAVENOUS | Status: AC
  Administered 2019-12-18: 12.5 g via INTRAVENOUS
  Filled 2019-12-18: qty 50

## 2019-12-18 MED ORDER — POLYETHYLENE GLYCOL 3350 17 G PO PACK: 17.0000 g | PACK | Freq: Every day | ORAL | Status: DC

## 2019-12-18 MED ORDER — DOCUSATE SODIUM 50 MG/5ML PO LIQD: 100.0000 mg | Freq: Two times a day (BID) | ORAL | Status: DC

## 2019-12-18 MED ORDER — ASPIRIN EC 81 MG PO TBEC
81.0000 mg | DELAYED_RELEASE_TABLET | Freq: Every day | ORAL | Status: DC
  Administered 2019-12-19 – 2019-12-22 (×4): 81 mg via ORAL
  Filled 2019-12-18 (×4): qty 1

## 2019-12-18 MED ORDER — PANTOPRAZOLE SODIUM 40 MG PO TBEC
40.0000 mg | DELAYED_RELEASE_TABLET | Freq: Every day | ORAL | Status: DC
  Administered 2019-12-18 – 2019-12-22 (×5): 40 mg via ORAL
  Filled 2019-12-18 (×5): qty 1

## 2019-12-18 MED ORDER — FLUTICASONE FUROATE-VILANTEROL 200-25 MCG/INH IN AEPB
1.0000 | INHALATION_SPRAY | Freq: Every day | RESPIRATORY_TRACT | Status: DC
  Administered 2019-12-19 – 2019-12-22 (×3): 1 via RESPIRATORY_TRACT
  Filled 2019-12-18 (×2): qty 28

## 2019-12-18 MED ORDER — ORAL CARE MOUTH RINSE
15.0000 mL | Freq: Two times a day (BID) | OROMUCOSAL | Status: DC
  Administered 2019-12-18 (×2): 15 mL via OROMUCOSAL

## 2019-12-18 MED ORDER — DOCUSATE SODIUM 100 MG PO CAPS
100.0000 mg | ORAL_CAPSULE | Freq: Two times a day (BID) | ORAL | Status: DC
  Administered 2019-12-18 – 2019-12-22 (×8): 100 mg via ORAL
  Filled 2019-12-18 (×8): qty 1

## 2019-12-18 NOTE — Plan of Care (Signed)
  Problem: Nutrition: Goal: Adequate nutrition will be maintained Outcome: Progressing   

## 2019-12-18 NOTE — Evaluation (Signed)
Physical Therapy Evaluation Patient Details Name: Allen Barnes MRN: [id number] DOB: 1941/03/01 Today's Date: 12/18/2019   History of Present Illness  79 y.o. male  With PMH parkinson's disease, HTN, COPD ( baseline 2L O2), DM2, CAD who presented to [facility] ED as a code stroke for c/o left facial droop right gaze. Pt fell and hit the back of his head, reporting some dizziness but no LOC. Later became dizzy again with facial droop and called EMS. CTH demonstrating acute nonhemorrhagic infarct involving the right lentiform  Clinical Impression  Pt presents to PT with deficits in functional mobility, gait, balance, endurance, power, strength, coordination and cognition. Pt currently requires physical assistance to perform all functional mobility with a strong posterior lean initially durign transfers, improved by use of a RW for UE support. Pt does demonstrate some resting tremors in RUE during session which family reports is baseline. Pt also demonstrates some impaired proprioception in LUE, needing assistance to place the extremity on RW. Pt will benefit from continued acute PT services to improve mobility and gait quality and to reduce falls risk.    Follow Up Recommendations CIR    Equipment Recommendations  Wheelchair (measurements PT);Wheelchair cushion (measurements PT) (if home today)    Recommendations for Other Services Rehab consult     Precautions / Restrictions Precautions Precautions: Fall Restrictions Weight Bearing Restrictions: No      Mobility  Bed Mobility Overal bed mobility: Needs Assistance Bed Mobility: Supine to Sit     Supine to sit: Mod assist        Transfers Overall transfer level: Needs assistance Equipment used: Rolling walker (2 wheeled);1 person hand held assist Transfers: Sit to/from [facility] Sit to Stand: Mod assist Stand pivot transfers: Min assist       General transfer comment: pt performs 2 sit to stands with modA due to  posterior lean, minA with use of RW  Ambulation/Gait                Stairs            Wheelchair Mobility    Modified Rankin (Stroke Patients Only) Modified Rankin (Stroke Patients Only) Pre-Morbid Rankin Score: No symptoms Modified Rankin: Moderately severe disability     Balance Overall balance assessment: Needs assistance Sitting-balance support: Single extremity supported;Feet supported Sitting balance-Leahy Scale: Poor Sitting balance - Comments: minA to maintain static sitting balance at the edge of bed   Standing balance support: Bilateral upper extremity supported Standing balance-Leahy Scale: Poor Standing balance comment: minA to maintain static standing balance with BUE of RW, modA without UE support due to posterior lean                             Pertinent Vitals/Pain Pain Assessment: No/denies pain    Home Living Family/patient expects to be discharged to:: Private residence Living Arrangements: Alone Available Help at Discharge: Family;Available 24 hours/day Type of Home: House Home Access: Stairs to enter Entrance Stairs-Rails: Left Entrance Stairs-Number of Steps: 2 Home Layout: One level Home Equipment: Cane - single point;Grab bars - tub/shower      Prior Function Level of Independence: Independent with assistive device(s)         Comments: pt ambulates with cane     Hand Dominance        Extremity/Trunk Assessment   Upper Extremity Assessment Upper Extremity Assessment: Generalized weakness    Lower Extremity Assessment Lower Extremity Assessment: Generalized weakness  Cervical / Trunk Assessment Cervical / Trunk Assessment: Normal  Communication   Communication: HOH  Cognition Arousal/Alertness: Awake/alert Behavior During Therapy: Flat affect Overall Cognitive Status: Impaired/Different from baseline Area of Impairment: Attention;Memory;Following commands;Safety/judgement;Awareness;Problem  solving                   Current Attention Level: Sustained Memory: Decreased recall of precautions;Decreased short-term memory Following Commands: Follows one step commands with increased time Safety/Judgement: Decreased awareness of safety;Decreased awareness of deficits Awareness: Emergent Problem Solving: Slow processing;Requires verbal cues;Difficulty sequencing        General Comments General comments (skin integrity, edema, etc.): pt and family report the pt has "Parkinson's of his R arm", PT notes tremoring of LUE with initial mobility but then later more consistent tremoring of RUE throughout the rest of session. pt reports some dizziness with mild drop in BP to 116/74 but is asymptomatic once seated and resting in recliner    Exercises     Assessment/Plan    PT Assessment Patient needs continued PT services  PT Problem List Decreased strength;Decreased activity tolerance;Decreased balance;Decreased mobility;Decreased cognition;Decreased coordination;Decreased knowledge of use of DME;Decreased safety awareness;Decreased knowledge of precautions       PT Treatment Interventions DME instruction;Gait training;Stair training;Functional mobility training;Therapeutic activities;Therapeutic exercise;Balance training;Neuromuscular re-education;Cognitive remediation;Patient/family education    PT Goals (Current goals can be found in the Care Plan section)  Acute Rehab PT Goals Patient Stated Goal: To improve mobility and aide in a return to independence PT Goal Formulation: With patient/family Time For Goal Achievement: 01/01/20 Potential to Achieve Goals: Good    Frequency Min 4X/week   Barriers to discharge        Co-evaluation               AM-PAC PT "6 Clicks" Mobility  Outcome Measure Help needed turning from your back to your side while in a flat bed without using bedrails?: A Little Help needed moving from lying on your back to sitting on the side of  a flat bed without using bedrails?: A Lot Help needed moving to and from a bed to a chair (including a wheelchair)?: A Little Help needed standing up from a chair using your arms (e.g., wheelchair or bedside chair)?: A Lot Help needed to walk in hospital room?: A Lot Help needed climbing 3-5 steps with a railing? : Total 6 Click Score: 13    End of Session   Activity Tolerance: Patient tolerated treatment well Patient left: in chair;with call bell/phone within reach;with chair alarm set;with family/visitor present Nurse Communication: Mobility status PT Visit Diagnosis: Unsteadiness on feet (R26.81);Muscle weakness (generalized) (M62.81);Other symptoms and signs involving the nervous system (R29.898)    Time: 2876-8115 PT Time Calculation (min) (ACUTE ONLY): 33 min   Charges:   PT Evaluation $PT Eval Moderate Complexity: 1 Mod PT Treatments $Therapeutic Activity: 8-22 mins       Zenaida Niece, PT, DPT Acute Rehabilitation Pager: [phone]   Zenaida Niece 12/18/2019, 1:53 PM

## 2019-12-18 NOTE — Procedures (Signed)
Extubation Procedure Note  Patient Details:   Name: Allen Barnes DOB: 29-Nov-1940 MRN: [id number]   Airway Documentation:    Vent end date: 12/18/19 Vent end time: 0933   Evaluation  O2 sats: stable throughout Complications: No apparent complications Patient did tolerate procedure well. Bilateral Breath Sounds: Clear, Diminished   Yes   Positive cuff leak noted. Patient placed on [place] 2 L with humidity. No stridor noted.  [name] 12/18/2019, 9:46 AM

## 2019-12-18 NOTE — Progress Notes (Signed)
  Echocardiogram 2D Echocardiogram has been performed.  Allen Barnes 12/18/2019, 4:10 PM

## 2019-12-18 NOTE — Progress Notes (Addendum)
NAME:  Allen Barnes, MRN:  [id number], DOB:  Oct 15, 1940, LOS: 1 ADMISSION DATE:  12/17/2019, CONSULTATION DATE:  12/18/19 REFERRING MD:  Rory Percy, CHIEF COMPLAINT:   Acute CVA  Brief History   79 year old male heel had a syncopal episode around 8 AM on 6/19 when he had residual dizziness and left-sided facial droop.  Found to have acute occlusion of the right carotid and right M1.  Taken for thrombectomy and transferred to the intensive care unit intubated.  History of present illness   Allen Barnes is a 79 year old male with a past medical history of Parkinson disease, type 2 diabetes, CAD, COPD on 2 L home O2 and bladder mass s/p recent TURP  who was in his usual state of health until he felt dizzy and had a syncopal episode around 8 AM on 6/19, later had left facial droop left upper extremity weakness so was brought to the emergency department by EMS.  In the ED, initial blood pressure was 152/83 and head CT positive for acute CVA in the right lentiform nucleus and posterior right insular ribbon.   CTA with R M1 LVO.  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 (08/31/2015), Coronary artery disease involving native coronary artery of native heart without angina pectoris (04/10/2015), Coronary artery stenosis (08/31/2015), Diabetes mellitus type 2, controlled ([place]) (08/31/2015), Essential hypertension (08/31/2015),  High risk medication use (08/31/2015), Insomnia (08/31/2015), Metatarsalgia of right foot (12/11/2015), Microscopic hematuria (06/02/2019), Mixed hyperlipidemia (08/31/2015), Nicotine dependence, uncomplicated (1/69/6789), Pain of right heel (12/02/2018), Parkinson's disease ([place]) (08/31/2015), Plantar fasciitis of right foot (03/11/2016), 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   Significant Hospital Events   12/18/19 Admit to Neurology, transferred to ICU post revascularization  Consults:  PCCM  Procedures:  12/18/19 ETT 12/18/19 IR PERCUTANEOUS ART THROMBECTOMY  Significant Diagnostic Tests:  6/19 CT head>>Acute infarct involving the right lentiform nucleus, and posterior right insular ribbon. Infarct may involve portions of the temporal lobe. 6/19 CTA head/neck>> Right MCA territory infarct with core infarct of 38 mL. R M1 large vessel occlusion. 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 6/19 CT C-spine>>no acute fracture  Micro Data:  6/19 Sars-CoV-2>>negative 6/19 MRSA screen>>  Antimicrobials:    Interim history/subjective:  This AM on SBT.   Objective   Blood pressure 132/60, pulse 66, temperature 100.2 F (37.9 C), temperature source Oral, resp. rate (!) 22, height 6' (1.829 m), weight 87.8 kg, SpO2 100 %.    Vent Mode: PSV;CPAP FiO2 (%):  [40 %-100 %] 40 % Set Rate:  [14 bmp] 14 bmp Vt Set:  [620 mL] 620 mL PEEP:  [5 cmH20] 5 cmH20 Pressure Support:  [5 cmH20] 5 cmH20 Plateau Pressure:  [14 cmH20-17 cmH20] 17 cmH20   Intake/Output Summary (Last 24 hours) at 12/18/2019 1328 Last data filed at 12/18/2019 1200 Gross per 24 hour  Intake 2751.94 ml  Output 3850 ml  Net -1098.06 ml   Filed Weights   12/17/19 2030  Weight: 87.8 kg    General:  Elderly M, lying in bed, no distress  HEENT: MM  pink/moist, ETT in place Neuro: Alert, follows commands, pupils intact, moves all extermities  CV: RRR, no MRG PULM: Clear breath sounds, no crackles/wheeze  GI: soft, non-distended, non-tender  Extremities: warm/dry, no edema  Skin: no rashes or lesions   Resolved Hospital Problem list     Assessment & Plan:   Acute R CVA and M1 LVO Neurology primary, likely atheroembolic etiology. S/p thrombectomy that was complicated by heavy calcified plaque of the R carotid bulb. Mechanical thrombectomy performed with one stent retriever pass in the right MCA via transcirculation approach -intubated for procedure and left intubated secondary to COPD and sedation Plan -Currently on SBT >> Pulling good TV, Follows commands >> Will Exubate now  -Per Neurology >> MRI this AM with abnormal flow within right ICA, consistent with chronic right ICA occlusion  -BP goal 140-160 (currently on low dose Celeviprex)   History of COPD On 2L home O2, chronic findings on CXR Plan   -Titrate Supplemental Oxygen to Maintain Saturation >92  -Scheduled duonebs and Breo  Type 2 DM -AIC 5.8 Plan -Trend Glucose  -SSI  HTN, CAD On Cardizem for post-procedure BP control Plan  -hold home Imdur and Cozaar for now, transition to po BP control as able Waiting for Speech Evaluation.  -Echo pending -Will restart ASA and Lipitor when able to take PO  Recent TURP Plan -continue foley and Flomax  Best practice:  Diet: N.p.o.  DVT prophylaxis: SCDs GI prophylaxis: Protonix Glucose control: SSI Mobility: Bedrest Code Status: Full code Family Communication: Per primary Disposition: ICU  Labs   CBC: Recent Labs  Lab 12/17/19 1352 12/17/19 1354 12/17/19 2054 12/18/19 0829  WBC  --  8.2  --  7.1  NEUTROABS  --  6.4  --   --   HGB 12.6* 12.3* 14.3 12.4*  HCT 37.0* 37.9* 42.0 37.3*  MCV  --  104.1*  --  103.3*  PLT  --  128*  --  129*    Basic Metabolic Panel: Recent Labs  Lab 12/17/19 1352  12/17/19 1354 12/17/19 2054 12/18/19 0829  NA 139 138 139 141  K 3.9 4.1 4.1 3.6  CL 104 106  --  108  CO2  --  24  --  25  GLUCOSE 90 95  --  113*  BUN 21 20  --  13  CREATININE 1.10 1.21  --  1.10  CALCIUM  --  8.4*  --  8.7*  MG  --   --   --  1.7  PHOS  --   --   --  3.2   GFR: Estimated Creatinine Clearance: 60.7 mL/min (by C-G formula based on SCr of 1.1 mg/dL). Recent Labs  Lab 12/17/19 1354 12/18/19 0829  WBC 8.2 7.1    Liver Function Tests: Recent Labs  Lab 12/17/19 1354  AST 21  ALT 15  ALKPHOS 38  BILITOT 1.0  PROT 5.6*  ALBUMIN 3.4*   No results for input(s): LIPASE, AMYLASE in the last 168 hours. No results for input(s): AMMONIA in the last 168 hours.  ABG    Component Value Date/Time   PHART 7.286 (L) 12/17/2019 2054   PCO2ART 50.8 (H) 12/17/2019 2054   PO2ART 301 (H) 12/17/2019 2054   HCO3 24.3 12/17/2019 2054   TCO2 26 12/17/2019 2054   ACIDBASEDEF 3.0 (H) 12/17/2019 2054   O2SAT 100.0 12/17/2019 2054  Coagulation Profile: Recent Labs  Lab 12/17/19 1354  INR 1.1    Cardiac Enzymes: No results for input(s): CKTOTAL, CKMB, CKMBINDEX, TROPONINI in the last 168 hours.  HbA1C: Hgb A1c MFr Bld  Date/Time Value Ref Range Status  12/18/2019 04:56 AM 5.8 (H) 4.8 - 5.6 % Final    Comment:    (NOTE) Pre diabetes:          5.7%-6.4%  Diabetes:              >6.4%  Glycemic control for   <7.0% adults with diabetes     CBG: Recent Labs  Lab 12/18/19 0309 12/18/19 0311 12/18/19 0723 12/18/19 0807 12/18/19 1134  GLUCAP 46* 81 66* 91 85    Review of Systems:   Unable to obtain secondary to sedation  Past Medical History  He,  has a past medical history of Acquired plantar porokeratosis (11/18/2018), Anxiety disorder (08/31/2015), Asthma (08/31/2015), Atherosclerosis of abdominal aorta ([place]) (02/21/2019), Atherosclerosis of both carotid arteries (05/18/2018), BPH without urinary obstruction (08/31/2015), Calculus of gallbladder without  cholecystitis without obstruction (05/18/2018), Callus of foot (12/11/2015), Colon polyps (09/03/2015), COPD (chronic obstructive pulmonary disease) with chronic bronchitis ([place]) (08/31/2015), 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 (08/31/2015), Coronary artery disease involving native coronary artery of native heart without angina pectoris (04/10/2015), Coronary artery stenosis (08/31/2015), Diabetes mellitus type 2, controlled ([place]) (08/31/2015), Essential hypertension (08/31/2015), High risk medication use (08/31/2015), Insomnia (08/31/2015), Metatarsalgia of right foot (12/11/2015), Microscopic hematuria (06/02/2019), Mixed hyperlipidemia (08/31/2015), Nicotine dependence, uncomplicated (3/55/7322), Pain of right heel (12/02/2018), Parkinson's disease ([place]) (08/31/2015), Plantar fasciitis of right foot (03/11/2016), 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   Surgical History    Past Surgical History:  Procedure Laterality Date  . BACK SURGERY    . CATARACT EXTRACTION    . heel Right   . KNEE SURGERY Right   . SHOULDER SURGERY Left      Social History   reports that he has quit smoking. His smoking use included cigarettes. He has a 13.50 pack-year smoking history. He uses smokeless tobacco. He reports current alcohol use of about 1.0 standard drink of alcohol per week. He reports that he does not use drugs.   Family History   His family history is not on file.   Allergies Allergies  Allergen Reactions  . Carbidopa-Levodopa Nausea And Vomiting    Mainly vomiting  . Tramadol Rash     Home Medications  Prior to Admission medications   Medication Sig Start Date End Date Taking? Authorizing Provider  acetaminophen (TYLENOL) 500 MG tablet Take 500 mg by mouth 2 (two) times daily.    [provider]    ALPRAZolam Duanne Moron) 1 MG tablet Take one-half to one by mouth 8 hours prn rescue/anxiety. 04/04/16   [provider]  aspirin EC 81 MG tablet Take 81 mg by mouth daily.     [provider]  Adair Patter 025-42 MCG/INH AEPB  07/04/19   [provider]  Cyanocobalamin (RA VITAMIN B-12 TR) 1000 MCG TBCR Take by mouth daily.     [provider]  docusate sodium (COLACE) 50 MG capsule Take 50 mg by mouth as needed for mild constipation.    [provider]  Garlic 7062 MG CAPS Take by mouth at bedtime.    [provider]  isosorbide mononitrate (IMDUR) 30 MG 24  hr tablet Take 1 tablet (30 mg total) by mouth daily. 10/14/19 01/12/20  Park Liter, MD  losartan (COZAAR) 100 MG tablet Take 100 mg by mouth daily.  04/03/16   [provider]  nitroGLYCERIN (NITROSTAT) 0.4 MG SL tablet Place 1 tablet (0.4 mg total) under the tongue every 5 (five) minutes as needed for chest pain. 07/06/19 10/04/19  Park Liter, MD  OXYGEN 2L qhs    [provider]  pravastatin (PRAVACHOL) 20 MG tablet Take 20 mg by mouth daily.  09/03/15   [provider]  SYMBICORT 160-4.5 MCG/ACT inhaler  10/05/19   [provider]  tamsulosin (FLOMAX) 0.4 MG CAPS capsule 0.4 mg 2 (two) times daily.  03/25/16   [provider]     Critical care time: 55 minutes     CRITICAL CARE Performed by: Omar Person   Total critical care time: 32 minutes  Critical care time was exclusive of separately billable procedures and treating other patients.  Critical care was necessary to treat or prevent imminent or life-threatening deterioration.  Critical care was time spent personally by me on the following activities: development of treatment plan with patient and/or surrogate as well as nursing, discussions with consultants, evaluation of patient's response to treatment, examination of patient, obtaining history from patient or surrogate,  ordering and performing treatments and interventions, ordering and review of laboratory studies, ordering and review of radiographic studies, pulse oximetry and re-evaluation of patient's condition.  Omar Person, NP

## 2019-12-18 NOTE — Evaluation (Signed)
SLP Cancellation Note  Patient Details Name: Standley Bargo MRN: [id number] DOB: 1940-11-26   Cancelled treatment:       Reason Eval/Treat Not Completed: Other (comment) (pt intubated at this time, will continue efforts)   Macario Golds 12/18/2019, 8:16 AM   Kathleen Lime, MS [place] Office [phone]

## 2019-12-18 NOTE — Progress Notes (Signed)
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  Right ICA chronic occlusion; Right M1 acute occlusion.  Cerebral angiography 12/17/19 by Dr. Karenann Cai.  Findings: Heavily calcified occlusive plaque in the right carotid bulb. Access to the right ICA distal to the occlusion failed after multiple attempts with different wires and catheters. Distal access only attained via false lumen without connection with the true lumen. Mechanical thrombectomy performed with one stent retriever pass in the right MCA via transcirculation approach (left ICA - AComA). Contrast opacification of the right MCA seen via ACom. Evaluation difficult due to faint contrast but penetration of the distal branches is seen (likely TICI2B). Embolus to the distal left A3 noted. Mechanical thrombectomy performed with recanalization up to the A4 segment.   Subjective:  Sleeping, arouses easily to voice. No complaints. Nurse reports he is doing well.  Allergies: Carbidopa-levodopa and Tramadol  Medications: Prior to Admission medications   Medication Sig Start Date End Date Taking? Authorizing Provider  acetaminophen (TYLENOL) 500 MG tablet Take 500 mg by mouth 2 (two) times daily.   Yes [provider]  ALPRAZolam Duanne Moron) 1 MG tablet Take 1.5 mg by mouth every 8 (eight) hours as needed for anxiety.  04/04/16  Yes [provider]  aspirin EC 81 MG tablet Take 81 mg by mouth at bedtime.    Yes [provider]  BREO ELLIPTA 200-25 MCG/INH AEPB Inhale 1 puff into the lungs daily.  07/04/19  Yes [provider]  Cyanocobalamin (RA VITAMIN B-12 TR) 1000 MCG TBCR Take 1 tablet by mouth daily.    Yes [provider]  docusate sodium (COLACE) 50 MG capsule Take 50 mg by mouth as needed for mild constipation.   Yes [provider]  Garlic 3419 MG CAPS Take 1 capsule by mouth at bedtime.    Yes [provider]  isosorbide mononitrate (IMDUR) 30 MG 24 hr tablet Take 1 tablet (30 mg total) by mouth daily. 10/14/19 01/12/20 Yes Park Liter, MD  levofloxacin (LEVAQUIN) 250 MG tablet Take 250 mg by mouth daily. 12/15/19  Yes [provider]  losartan (COZAAR) 100 MG tablet Take 100 mg by mouth daily.  04/03/16  Yes [provider]  OXYGEN Inhale 2 L into the lungs at bedtime.    Yes [provider]  pravastatin (PRAVACHOL) 20 MG tablet Take 20 mg by mouth daily.  09/03/15  Yes [provider]  SYMBICORT 160-4.5 MCG/ACT inhaler Inhale 2 puffs into the lungs 2 (two) times daily.  10/05/19  Yes [provider]  tamsulosin (FLOMAX) 0.4 MG CAPS capsule 0.4 mg 2 (two) times daily.  03/25/16  Yes [provider]  triamcinolone cream (KENALOG) 0.1 % Apply 1 application topically 2 (two) times daily as needed. 11/30/19  Yes [provider]  nitroGLYCERIN (NITROSTAT) 0.4 MG SL tablet Place 1 tablet (0.4 mg total) under the tongue every 5 (five) minutes as needed for chest pain.  07/06/19 10/04/19  Park Liter, MD     Vital Signs: BP 122/62   Pulse (!) 54   Temp 100.2 F (37.9 C) (Oral) Comment: gave tylenol  Resp 13   Ht 6' (1.829 m)   Wt 87.8 kg   SpO2 100%   BMI 26.25 kg/m   Physical Exam Constitutional:      Appearance: Normal appearance.  HENT:     Head: Normocephalic and atraumatic.  Cardiovascular:     Rate and Rhythm: Normal rate.  Pulmonary:     Effort: Pulmonary effort is normal. No  respiratory distress.  Abdominal:     Palpations: Abdomen is soft.     Tenderness: There is no abdominal tenderness.  Musculoskeletal:     Comments: Right groin access site looks good, no hematoma, no pseudoaneurysm.  Skin:    General: Skin is warm and dry.  Neurological:     Mental Status: He is alert and oriented to person, place, and time.  Psychiatric:        Mood and Affect: Mood normal.        Behavior: Behavior normal.        Thought Content: Thought content normal.        Judgment: Judgment normal.     Imaging: CT Code Stroke CTA Head W/WO contrast  Result Date: 12/17/2019 CLINICAL DATA:  Left-sided facial droop.  Abnormal CT scan. EXAM: CT ANGIOGRAPHY HEAD AND NECK CT PERFUSION BRAIN TECHNIQUE: Multidetector CT imaging of the head and neck was performed using the standard protocol during bolus administration of intravenous contrast. Multiplanar CT image reconstructions and MIPs were obtained to evaluate the vascular anatomy. Carotid stenosis measurements (when applicable) are obtained utilizing NASCET criteria, using the distal internal carotid diameter as the denominator. Multiphase CT imaging of the brain was performed following IV bolus contrast injection. Subsequent parametric perfusion maps were calculated using RAPID software. CONTRAST:  144mL OMNIPAQUE IOHEXOL 350 MG/ML SOLN COMPARISON:  CT head without contrast 12/17/2019 FINDINGS: CTA NECK FINDINGS Aortic arch: A 3 vessel arch configuration is present. Atherosclerotic changes are present in the arch and at the origins of the innominate and subclavian arteries without significant stenosis or aneurysm. Right carotid system: Minimal wall calcifications are present within the right common carotid artery. Dense calcifications are present bifurcation. The right ICA is occluded without reconstitution in the neck. Left carotid system: Left carotid artery demonstrates scattered mural calcifications. Lumen is narrowed to 3.5 mm. No  significant stenosis of greater than 50% is present. Moderate tortuosity is present within the cervical left ICA without other stenosis. Vertebral arteries: The vertebral arteries originate from the subclavian arteries bilaterally. The right vertebral artery is the dominant vessel. Mild proximal stenosis of less than 50% are present bilaterally. No additional stenoses are present in either vertebral artery in the neck. Skeleton: Multilevel degenerative changes are present. No focal lytic or blastic lesions are present. Patient is edentulous. Other neck: Soft tissues the neck are otherwise unremarkable. Salivary glands are within normal limits. No significant adenopathy is present. Thyroid is normal. Upper chest: Centrilobular emphysematous changes are present. Focal nodule or mass lesion is present. Thoracic inlet is normal. Review of the MIP images confirms the above findings CTA HEAD FINDINGS Anterior circulation: Minimal contrast is present in the cavernous right internal carotid artery from the ophthalmic segment to the posterior communicating artery. There is severe narrowing of the terminal right ICA. Atherosclerotic changes are present the cavernous left ICA without focal stenosis through the ICA terminus. The  left A1 and M1 segments are normal. The anterior communicating artery is patent. Contrast is present in the right A1 segment. Right M1 segment is occluded. Minimal collaterals are present. Left MCA branch vessels and bilateral ACA branch vessels are within normal limits. Posterior circulation: The right vertebral artery is slightly dominant. PICA origins are visualized and normal. Vertebrobasilar junction is normal. Basilar artery is normal. Left posterior cerebral artery originates from basilar tip. The right posterior cerebral artery is fed by a small right posterior communicating artery and small right P1 segment. PCA branch vessels are intact bilaterally. Venous sinuses: The dural sinuses are  patent. The straight sinus and deep cerebral veins are intact. Cortical veins are unremarkable. No vascular malformation is present. Anatomic variants: Fetal type right posterior cerebral artery remains patent with a small right P1 segment Review of the MIP images confirms the above findings CT Brain Perfusion Findings: ASPECTS: 6/10 CBF (<30%) Volume: 26mL Perfusion (Tmax>6.0s) volume: 157mL Mismatch Volume: 14mL Infarction Location:Right MCA territory IMPRESSION: 1. 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 2. Proximal right M1 large vessel occlusion with minimal collaterals. 3. 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 4. Short-segment reconstitution of the right ICA in the cavernous segment continues to severe stenosis of the terminal right internal carotid artery. 5. The right posterior cerebral artery is fed by a small right posterior communicating artery and small right P1 segment. 6. Aortic Atherosclerosis (ICD10-I70.0) and Emphysema (ICD10-J43.9). 7. Multilevel degenerative changes in the cervical spine. These results were called by telephone at the time of interpretation on 12/17/2019 at 2:35 pm to provider Rory Percy, who verbally acknowledged these results. Electronically Signed   By: San Morelle M.D.   On: 12/17/2019 15:10   CT Code Stroke CTA Neck W/WO contrast  Result Date: 12/17/2019 CLINICAL DATA:  Left-sided facial droop.  Abnormal CT scan. EXAM: CT ANGIOGRAPHY HEAD AND NECK CT PERFUSION BRAIN TECHNIQUE: Multidetector CT imaging of the head and neck was performed using the standard protocol during bolus administration of intravenous contrast. Multiplanar CT image reconstructions and MIPs were obtained to evaluate the vascular anatomy. Carotid stenosis measurements (when applicable) are obtained utilizing NASCET criteria, using the distal internal carotid diameter as the denominator. Multiphase CT imaging of the brain was performed following IV bolus  contrast injection. Subsequent parametric perfusion maps were calculated using RAPID software. CONTRAST:  136mL OMNIPAQUE IOHEXOL 350 MG/ML SOLN COMPARISON:  CT head without contrast 12/17/2019 FINDINGS: CTA NECK FINDINGS Aortic arch: A 3 vessel arch configuration is present. Atherosclerotic changes are present in the arch and at the origins of the innominate and subclavian arteries without significant stenosis or aneurysm. Right carotid system: Minimal wall calcifications are present within the right common carotid artery. Dense calcifications are present bifurcation. The right ICA is occluded without reconstitution in the neck. Left carotid system: Left carotid artery demonstrates scattered mural calcifications. Lumen is narrowed to 3.5 mm. No significant stenosis of greater than 50% is present. Moderate tortuosity is present within the cervical left ICA without other stenosis. Vertebral arteries: The vertebral arteries originate from the subclavian arteries bilaterally. The right vertebral artery is the dominant vessel. Mild proximal stenosis of less than 50% are present bilaterally. No additional stenoses are present in either vertebral artery in the neck. Skeleton: Multilevel degenerative changes are present. No focal lytic or blastic lesions are present. Patient is edentulous. Other neck: Soft tissues the neck are otherwise unremarkable. Salivary glands are within normal limits. No significant adenopathy is  present. Thyroid is normal. Upper chest: Centrilobular emphysematous changes are present. Focal nodule or mass lesion is present. Thoracic inlet is normal. Review of the MIP images confirms the above findings CTA HEAD FINDINGS Anterior circulation: Minimal contrast is present in the cavernous right internal carotid artery from the ophthalmic segment to the posterior communicating artery. There is severe narrowing of the terminal right ICA. Atherosclerotic changes are present the cavernous left ICA without  focal stenosis through the ICA terminus. The left A1 and M1 segments are normal. The anterior communicating artery is patent. Contrast is present in the right A1 segment. Right M1 segment is occluded. Minimal collaterals are present. Left MCA branch vessels and bilateral ACA branch vessels are within normal limits. Posterior circulation: The right vertebral artery is slightly dominant. PICA origins are visualized and normal. Vertebrobasilar junction is normal. Basilar artery is normal. Left posterior cerebral artery originates from basilar tip. The right posterior cerebral artery is fed by a small right posterior communicating artery and small right P1 segment. PCA branch vessels are intact bilaterally. Venous sinuses: The dural sinuses are patent. The straight sinus and deep cerebral veins are intact. Cortical veins are unremarkable. No vascular malformation is present. Anatomic variants: Fetal type right posterior cerebral artery remains patent with a small right P1 segment Review of the MIP images confirms the above findings CT Brain Perfusion Findings: ASPECTS: 6/10 CBF (<30%) Volume: 36mL Perfusion (Tmax>6.0s) volume: 156mL Mismatch Volume: 111mL Infarction Location:Right MCA territory IMPRESSION: 1. 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 2. Proximal right M1 large vessel occlusion with minimal collaterals. 3. 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 4. Short-segment reconstitution of the right ICA in the cavernous segment continues to severe stenosis of the terminal right internal carotid artery. 5. The right posterior cerebral artery is fed by a small right posterior communicating artery and small right P1 segment. 6. Aortic Atherosclerosis (ICD10-I70.0) and Emphysema (ICD10-J43.9). 7. Multilevel degenerative changes in the cervical spine. These results were called by telephone at the time of interpretation on 12/17/2019 at 2:35 pm to provider Rory Percy, who verbally  acknowledged these results. Electronically Signed   By: San Morelle M.D.   On: 12/17/2019 15:10   MR BRAIN WO CONTRAST  Result Date: 12/18/2019 CLINICAL DATA:  Follow-up examination for acute stroke. Status post catheter directed intervention. EXAM: MRI HEAD WITHOUT CONTRAST TECHNIQUE: Multiplanar, multiecho pulse sequences of the brain and surrounding structures were obtained without intravenous contrast. COMPARISON:  Prior studies from 12/17/2019. FINDINGS: Brain: Generalized age-related cerebral atrophy. Scattered patchy T2/FLAIR hyperintensity within the periventricular and deep white matter both cerebral hemispheres most consistent with chronic small vessel ischemic disease, mild in nature. Patchy multifocal areas of restricted diffusion seen involving the right MCA distribution, primarily involving the right insula and adjacent right temporal lobe, with extension to involve the posterior right frontoparietal region. Involvement of the right caudate and lentiform nuclei present as well. No associated hemorrhage or mass effect. Scattered FLAIR hyperintensity seen within several cortical sulci within the area of infarction without susceptibility artifact, likely related to vascular congestion. Few additional punctate foci of diffusion abnormality noted at the cortical and subcortical posterior left frontal region (series 5, image 88). No other areas of acute or subacute ischemia. Gray-white matter differentiation otherwise maintained. No evidence for acute intracranial hemorrhage. Vague FLAIR signal intensity partially surrounding the midbrain and pons noted without associated SWI correlate, favored to be artifactual in nature. Single punctate chronic microhemorrhage noted within the left  frontal centrum semi ovale, of doubtful significance in isolation. No mass lesion, midline shift or mass effect. No hydrocephalus. No extra-axial fluid collection. Pituitary gland suprasellar region within normal  limits. Midline structures intact. Vascular: Abnormal flow void within the right ICA to the cavernous segment, consistent with known chronic right ICA occlusion. Otherwise, major intracranial vascular flow voids are maintained, with particular note made of preserved flow voids within the right MCA distribution. Skull and upper cervical spine: Craniocervical junction within normal limits. Degenerative spondylosis noted at C3-4 with resultant mild spinal stenosis. Bone marrow signal intensity within normal limits. No scalp soft tissue abnormality. Sinuses/Orbits: Patient status post bilateral ocular lens replacement. Scattered mucosal thickening noted throughout the paranasal sinuses, inflammatory/allergic in nature and likely chronic. No evidence for acute sinusitis. No mastoid effusion. Inner ear structures grossly normal. Other: None. IMPRESSION: 1. Patchy multifocal acute ischemic right MCA territory infarcts as above. No associated hemorrhage or mass effect. 2. Abnormal flow void within the right ICA to the cavernous segment, consistent with known chronic right ICA occlusion. Major intracranial vascular flow voids are otherwise maintained. 3. Underlying age-related cerebral atrophy with mild chronic small vessel ischemic disease. Electronically Signed   By: Jeannine Boga M.D.   On: 12/18/2019 04:30   CT C-SPINE NO CHARGE  Result Date: 12/17/2019 CLINICAL DATA:  Headache, neurologic deficit EXAM: CT CERVICAL SPINE WITHOUT CONTRAST TECHNIQUE: Multidetector CT imaging of the cervical spine was performed without intravenous contrast. Multiplanar CT image reconstructions were also generated. COMPARISON:  10/23/2016 x-ray, 09/04/2011 and 08/29/2019 chest CT FINDINGS: Alignment: No facet joint dislocation. Dens and lateral masses are aligned. No traumatic listhesis. Skull base and vertebrae: No acute fracture. No primary bone lesion or focal pathologic process. Soft tissues and spinal canal: No prevertebral  fluid or swelling. No visible canal hematoma. Disc levels: Advanced multilevel disc height loss with degenerative endplate spurring. Severe multilevel left-sided facet arthropathy with suggestion of severe left-sided foraminal stenosis at C3-4, C4-5, and C5-6. Upper chest: Centrilobular and paraseptal emphysema within the lung apices. There are a few small 2-3 mm right upper lobe pulmonary nodules, stable from 09/04/2011. Mildly enlarged AP window lymph node, stable from 08/29/2019. Other: Aortic and carotid atherosclerotic calcification. IMPRESSION: 1. No acute fracture or traumatic listhesis of the cervical spine. 2. 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 3. Emphysema and aortic atherosclerosis. Aortic Atherosclerosis (ICD10-I70.0) and Emphysema (ICD10-J43.9). Electronically Signed   By: Davina Poke D.O.   On: 12/17/2019 15:18   CT Code Stroke Cerebral Perfusion with contrast  Result Date: 12/17/2019 CLINICAL DATA:  Left-sided facial droop.  Abnormal CT scan. EXAM: CT ANGIOGRAPHY HEAD AND NECK CT PERFUSION BRAIN TECHNIQUE: Multidetector CT imaging of the head and neck was performed using the standard protocol during bolus administration of intravenous contrast. Multiplanar CT image reconstructions and MIPs were obtained to evaluate the vascular anatomy. Carotid stenosis measurements (when applicable) are obtained utilizing NASCET criteria, using the distal internal carotid diameter as the denominator. Multiphase CT imaging of the brain was performed following IV bolus contrast injection. Subsequent parametric perfusion maps were calculated using RAPID software. CONTRAST:  122mL OMNIPAQUE IOHEXOL 350 MG/ML SOLN COMPARISON:  CT head without contrast 12/17/2019 FINDINGS: CTA NECK FINDINGS Aortic arch: A 3 vessel arch configuration is present. Atherosclerotic changes are present in the arch and at  the origins of the innominate and subclavian arteries without significant stenosis or aneurysm. Right carotid system: Minimal wall  calcifications are present within the right common carotid artery. Dense calcifications are present bifurcation. The right ICA is occluded without reconstitution in the neck. Left carotid system: Left carotid artery demonstrates scattered mural calcifications. Lumen is narrowed to 3.5 mm. No significant stenosis of greater than 50% is present. Moderate tortuosity is present within the cervical left ICA without other stenosis. Vertebral arteries: The vertebral arteries originate from the subclavian arteries bilaterally. The right vertebral artery is the dominant vessel. Mild proximal stenosis of less than 50% are present bilaterally. No additional stenoses are present in either vertebral artery in the neck. Skeleton: Multilevel degenerative changes are present. No focal lytic or blastic lesions are present. Patient is edentulous. Other neck: Soft tissues the neck are otherwise unremarkable. Salivary glands are within normal limits. No significant adenopathy is present. Thyroid is normal. Upper chest: Centrilobular emphysematous changes are present. Focal nodule or mass lesion is present. Thoracic inlet is normal. Review of the MIP images confirms the above findings CTA HEAD FINDINGS Anterior circulation: Minimal contrast is present in the cavernous right internal carotid artery from the ophthalmic segment to the posterior communicating artery. There is severe narrowing of the terminal right ICA. Atherosclerotic changes are present the cavernous left ICA without focal stenosis through the ICA terminus. The left A1 and M1 segments are normal. The anterior communicating artery is patent. Contrast is present in the right A1 segment. Right M1 segment is occluded. Minimal collaterals are present. Left MCA branch vessels and bilateral ACA branch vessels are within normal limits. Posterior  circulation: The right vertebral artery is slightly dominant. PICA origins are visualized and normal. Vertebrobasilar junction is normal. Basilar artery is normal. Left posterior cerebral artery originates from basilar tip. The right posterior cerebral artery is fed by a small right posterior communicating artery and small right P1 segment. PCA branch vessels are intact bilaterally. Venous sinuses: The dural sinuses are patent. The straight sinus and deep cerebral veins are intact. Cortical veins are unremarkable. No vascular malformation is present. Anatomic variants: Fetal type right posterior cerebral artery remains patent with a small right P1 segment Review of the MIP images confirms the above findings CT Brain Perfusion Findings: ASPECTS: 6/10 CBF (<30%) Volume: 79mL Perfusion (Tmax>6.0s) volume: 170mL Mismatch Volume: 121mL Infarction Location:Right MCA territory IMPRESSION: 1. 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 2. Proximal right M1 large vessel occlusion with minimal collaterals. 3. 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 4. Short-segment reconstitution of the right ICA in the cavernous segment continues to severe stenosis of the terminal right internal carotid artery. 5. The right posterior cerebral artery is fed by a small right posterior communicating artery and small right P1 segment. 6. Aortic Atherosclerosis (ICD10-I70.0) and Emphysema (ICD10-J43.9). 7. Multilevel degenerative changes in the cervical spine. These results were called by telephone at the time of interpretation on 12/17/2019 at 2:35 pm to provider Rory Percy, who verbally acknowledged these results. Electronically Signed   By: San Morelle M.D.   On: 12/17/2019 15:10   DG CHEST PORT 1 VIEW  Result Date: 12/17/2019 CLINICAL DATA:  Intubation. EXAM: PORTABLE CHEST 1 VIEW COMPARISON:  Radiograph 12/05/2019, chest CT 08/29/2019 FINDINGS: Endotracheal tube tip is 5.9 cm from the carina. Tip  and side port of the enteric tube below the diaphragm in the stomach, the tube may be kinked at the side port. Cardiomegaly with slight increase from prior exam. Chronic interstitial coarsening. Chronic right basilar pleural calcifications and blunting of the costophrenic angle.  No new airspace disease. No pneumothorax. IMPRESSION: 1. Endotracheal tube tip 5.9 cm from the carina. 2. Enteric tube tip and side port below the diaphragm in the stomach, the tube may be kinked at the side port. 3. Slight increase in cardiomegaly from prior. Chronic interstitial coarsening. Electronically Signed   By: Keith Rake M.D.   On: 12/17/2019 20:58   CT HEAD CODE STROKE WO CONTRAST  Result Date: 12/17/2019 CLINICAL DATA:  Code stroke. Left facial droop. Right-sided gaze. Last seen normal at 9 a.m. EXAM: CT HEAD WITHOUT CONTRAST TECHNIQUE: Contiguous axial images were obtained from the base of the skull through the vertex without intravenous contrast. COMPARISON:  CT head without contrast 12/05/2019 at [facility]. FINDINGS: Brain: Acute nonhemorrhagic infarct is present in the right lentiform nucleus, crossing the internal capsule and involving the right caudate head. Infarct involves the posterior left scratched at the infarct involves posterior right lentiform nucleus and portions of the right temporal lobe. Anterior insular cortex is intact. No additional cortical abnormalities are present over the convexity. Left basal ganglia is within normal limits. Moderate white matter changes are present bilaterally. No acute hemorrhage or mass lesion is present. The ventricles are of normal size. The brainstem and cerebellum are within normal limits. No significant extraaxial fluid collection is present. Vascular: Atherosclerotic calcifications are present within the cavernous internal carotid arteries bilaterally. No hyperdense vessel is present. Skull: Calvarium is intact. No focal lytic or blastic lesions are present. No  significant extracranial soft tissue lesion is present. Sinuses/Orbits: Benign chondroid lesion or ostium of the right frontal sinus is stable. The paranasal sinuses and mastoid air cells are otherwise clear. Bilateral lens replacements are noted. Globes and orbits are otherwise unremarkable. ASPECTS [facility] Stroke Program Early CT Score) - Ganglionic level infarction (caudate, lentiform nuclei, internal capsule, insula, M1-M3 cortex): 3/7 - Supraganglionic infarction (M4-M6 cortex): 3/3 Total score (0-10 with 10 being normal): 6/10 IMPRESSION: 1. Acute nonhemorrhagic infarct involving the right lentiform nucleus, crossing the internal capsule and portions of the right caudate head. 2. Acute nonhemorrhagic infarct involving the posterior right insular ribbon. Infarct may involve portions of the temporal lobe. 3. ASPECTS is 6/10. 4. Stable moderate white matter disease. The above was relayed via text pager to Dr. Rory Percy on 12/17/2019 at 14:06 . Electronically Signed   By: San Morelle M.D.   On: 12/17/2019 14:08    Labs:  CBC: Recent Labs    12/17/19 1352 12/17/19 1354 12/17/19 2054 12/18/19 0829  WBC  --  8.2  --  7.1  HGB 12.6* 12.3* 14.3 12.4*  HCT 37.0* 37.9* 42.0 37.3*  PLT  --  128*  --  129*    COAGS: Recent Labs    12/17/19 1354  INR 1.1  APTT 31    BMP: Recent Labs    12/17/19 1352 12/17/19 1354 12/17/19 2054 12/18/19 0829  NA 139 138 139 141  K 3.9 4.1 4.1 3.6  CL 104 106  --  108  CO2  --  24  --  25  GLUCOSE 90 95  --  113*  BUN 21 20  --  13  CALCIUM  --  8.4*  --  8.7*  CREATININE 1.10 1.21  --  1.10  GFRNONAA  --  57*  --  >60  GFRAA  --  >60  --  >60    LIVER FUNCTION TESTS: Recent Labs    12/17/19 1354  BILITOT 1.0  AST 21  ALT 15  ALKPHOS  38  PROT 5.6*  ALBUMIN 3.4*    Assessment and Plan:  Right ICA chronic occlusion; Right M1 acute occlusion.  Cerebral angiography 12/17/19 by Dr. Karenann Cai.  SBP parameters  120-160.  Continue current care.  Electronically Signed: Murrell Redden, PA-C 12/18/2019, 3:26 PM    I spent a total of 15 Minutes at the the patient's bedside AND on the patient's hospital floor or unit, greater than 50% of which was counseling/coordinating care for F/U stroke, cerebral intervention.

## 2019-12-18 NOTE — Progress Notes (Signed)
STROKE TEAM PROGRESS NOTE   HISTORY OF PRESENT ILLNESS (per record) Allen Barnes is an 79 y.o. male  With PMH parkinson's disease, HTN, COPD ( baseline 2L O2), DM2, CAD who presented to [facility] ED as a code stroke for c/o left facial droop right gaze. Patient wasLKW at 0800 when he fell and hit the back of his head. He was a little dizzy afterwards but no LOC. At 0900 he became dizzy again with some facial droop and EMS was called. Denies CP, blurred vision, LOC. 2nd COVID vaccine dose 08/2019. On Thursday, had a transurethral procedure for hematuria-questionable mass in the bladder.  Done at [place].  No records to review. ED course:  BP: 146/88 BG: 88 CTH: no hemorrhage; 1. Acute nonhemorrhagic infarct involving the right lentiform nucleus, crossing the internal capsule and portions of the right caudate head. 2. Acute nonhemorrhagic infarct involving the posterior right insular ribbon. Infarct may involve portions of the temporal lobe. 3. ASPECTS is 6/10. 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 11/29/12 pre-op surgical clearance note from [facility]. CTP: mismatch: 157 ml Date last known well 12/17/19:0800 tPA Given: no; outside of window Modified Rankin: Rankin Score=1 NIHSS:8 Significant delays in IV access-upwards of 35 to 40 minutes   INTERVAL HISTORY The patient is status post thrombectomy.  He has been itching to get the endotracheal tube removed and has been pulling on it.  He was finally extubated during rounds.  He does complain of sore throat which we did explain to him is normal.   OBJECTIVE Vitals:   12/18/19 0700 12/18/19 0715 12/18/19 0730 12/18/19 0800  BP: 133/62 135/63 137/65 (!) 119/56  Pulse: (!) 51 (!) 50 (!) 50 (!) 53  Resp: 14 14 14 14   Temp:    99.6 F (37.6 C)  TempSrc:    Oral  SpO2: 100% 100% 100% 100%  Weight:      Height:        CBC:  Recent Labs  Lab 12/17/19 1354  12/17/19 2054  WBC 8.2  --   NEUTROABS 6.4  --   HGB 12.3* 14.3  HCT 37.9* 42.0  MCV 104.1*  --   PLT 128*  --     Basic Metabolic Panel:  Recent Labs  Lab 12/17/19 1352 12/17/19 1352 12/17/19 1354 12/17/19 2054  NA 139   < > 138 139  K 3.9   < > 4.1 4.1  CL 104  --  106  --   CO2  --   --  24  --   GLUCOSE 90  --  95  --   BUN 21  --  20  --   CREATININE 1.10  --  1.21  --   CALCIUM  --   --  8.4*  --    < > = values in this interval not displayed.    Lipid Panel:     Component Value Date/Time   CHOL 148 12/18/2019 0456   TRIG 111 12/18/2019 0456   TRIG 114 12/18/2019 0456   HDL 56 12/18/2019 0456   CHOLHDL 2.6 12/18/2019 0456   VLDL 22 12/18/2019 0456   LDLCALC 70 12/18/2019 0456   HgbA1c:  Lab Results  Component Value Date   HGBA1C 5.8 (H) 12/18/2019   Urine Drug Screen: No results found for: LABOPIA, COCAINSCRNUR, LABBENZ, AMPHETMU, THCU, LABBARB  Alcohol Level No results found for: [place]  CT Code  Stroke CTA Head W/WO contrast CT Code Stroke CTA Neck W/WO contrast CT Code Stroke Cerebral Perfusion with contrast 12/17/2019 IMPRESSION:  1. 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  2. Proximal right M1 large vessel occlusion with minimal collaterals.  3. 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  4. Short-segment reconstitution of the right ICA in the cavernous segment continues to severe stenosis of the terminal right internal carotid artery.  5. The right posterior cerebral artery is fed by a small right posterior communicating artery and small right P1 segment.  6. Aortic Atherosclerosis (ICD10-I70.0) and Emphysema (ICD10-J43.9).  7. Multilevel degenerative changes in the cervical spine.   MR BRAIN WO CONTRAST 12/18/2019 IMPRESSION:  1. Patchy multifocal acute ischemic right MCA territory infarcts as above. No associated hemorrhage or mass effect.  2. Abnormal flow void within the right ICA to the  cavernous segment, consistent with known chronic right ICA occlusion. Major intracranial vascular flow voids are otherwise maintained.  3. Underlying age-related cerebral atrophy with mild chronic small vessel ischemic disease.   CT C-SPINE NO CHARGE 12/17/2019 IMPRESSION:  1. No acute fracture or traumatic listhesis of the cervical spine.  2. 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  3. Emphysema and aortic atherosclerosis. Aortic Atherosclerosis (ICD10-I70.0) and Emphysema (ICD10-J43.9).   CT HEAD CODE STROKE WO CONTRAST 12/17/2019 IMPRESSION:  1. Acute nonhemorrhagic infarct involving the right lentiform nucleus, crossing the internal capsule and portions of the right caudate head.  2. Acute nonhemorrhagic infarct involving the posterior right insular ribbon. Infarct may involve portions of the temporal lobe.  3. ASPECTS is 6/10.  4. Stable moderate white matter disease.   Neuro Interventional Radiology - Cerebral Angiogram with Intervention - Dr. Erven Colla de Sindy Messing  12/17/19 7:45 pm Diagnosis: Right ICA chronic occlusion; right M1 acute occlusion Heavily calcified occlusive plaque in the right carotid bulb. Access to the right ICA distal to the occlusion failed after multiple attempts with different wires and catheters. Distal access only attained via false lumen without connection with the true lumen. Mechanical thrombectomy performed with one stent retriever pass in the right MCA via transcirculation approach (left ICA - AComA). Contrast opacification of the right MCA seen via ACom. Evaluation difficult due to faint contrast but penetration of the distal branches is seen (likely TICI2B). Embolus to the distal left A3 noted. Mechanical thrombectomy performed with recanalization up to the A4 segment.   DG CHEST PORT 1 VIEW 12/17/2019 IMPRESSION:  1. Endotracheal tube tip  5.9 cm from the carina.  2. Enteric tube tip and side port below the diaphragm in the stomach, the tube may be kinked at the side port.  3. Slight increase in cardiomegaly from prior. Chronic interstitial coarsening.  Transthoracic Echocardiogram  00/00/2021 Pending  ECG - pending  PHYSICAL EXAM Blood pressure (!) 119/56, pulse (!) 53, temperature 99.6 F (37.6 C), temperature source Oral, resp. rate 14, height 6' (1.829 m), weight 87.8 kg, SpO2 100 %. GENERAL: He is doing decent at this time.  HEENT: Neck is supple and no trauma noted.  Voice is hoarse.  Hearing is impaired significantly.  ABDOMEN: soft  EXTREMITIES: No edema   BACK: Normal  SKIN: Normal by inspection.    MENTAL STATUS: He is awake and alert.  He does converses decently.  He does follow commands briskly.  There is mild dysarthria noted.  CRANIAL NERVES: Pupils  are equal, round and reactive to light; extra ocular movements are full, there is no significant nystagmus; visual fields shows a dense left homonymous hemianopia; upper and lower facial muscles are normal in strength and symmetric, there is no flattening of the nasolabial folds; tongue is midline; uvula is midline; shoulder elevation is normal.  MOTOR: Right upper and lower extremities show 4/5 strength.  Left upper extremity 3/5 and the left lower extremity 4.  COORDINATION: No tremors, dysmetria or myoclonus noted.  SENSATION: Normal to light touch and pain.       ASSESSMENT/PLAN [name] is a 79 y.o. male with history of parkinson's disease, HTN, COPD ( baseline 2L O2), DM2, CAD, 2nd COVID vaccine dose 08/2019, and on 12/15/19 he had a transurethral procedure at [facility] for hematuria-questionable mass in the bladder.  He presented to [facility] ED on 12/17/19 as a code stroke for c/o left facial droop right gaze. Patient was LKW at 0800 when he fell and hit the back of his head. He was a little dizzy afterwards but no LOC. He did not receive IV t-PA  due to late presentation (>4.5 hours from time of onset)  Neuro Interventional Radiology - Cerebral Angiogram with Intervention - Dr. Roylene Reason Sindy Messing  12/17/19 7:45 pm Diagnosis: Right ICA chronic occlusion; right M1 acute occlusion Mechanical thrombectomy right MCA (likely TICI2B). Embolus to the distal left A3 noted. Mechanical thrombectomy performed with recanalization up to the A4 segment.   Stroke: Patchy multifocal acute ischemic right MCA territory infarcts - embolic - source unknown.  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  Code Stroke CT Head - Acute nonhemorrhagic infarct involving the right lentiform nucleus, crossing the internal capsule and portions of the right caudate head. Acute nonhemorrhagic infarct involving the posterior right insular ribbon. Infarct may involve portions of the temporal lobe. ASPECTS is 6/10. Stable moderate white matter disease.   CT head - not ordered  MRI head - Patchy multifocal acute ischemic right MCA territory infarcts as above. No associated hemorrhage or mass effect. Abnormal flow void within the right ICA to the cavernous segment, consistent with known chronic right ICA occlusion. Major intracranial vascular flow voids are otherwise maintained. Underlying age-related cerebral atrophy with mild chronic small vessel ischemic disease.   MRA head - not ordered  CTA H&N - Proximal right M1 large vessel occlusion with minimal collaterals. 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 Short-segment reconstitution of the right ICA in the cavernous segment continues to severe stenosis of the terminal right internal carotid artery. The right posterior cerebral artery is fed by a small right posterior communicating artery and small right P1 segment  CT Perfusion -  Right MCA territory infarct with core infarct of 38 mL  Carotid Doppler - CTA neck performed - carotid dopplers not indicated.  2D Echo -  pending  Lacey Jensen Virus 2 - negative  LDL - 70  HgbA1c - 5.8  UDS - not ordered  VTE prophylaxis - SCDs Diet  Diet Order            Diet NPO time specified  Diet effective now                  aspirin 81 mg daily prior to admission, now on none  Patient will be counseled to be compliant with his antithrombotic medications  Ongoing aggressive stroke risk factor management  Therapy recommendations:  pending  Disposition:  Pending  Hypertension  Home  BP meds: Cozaar  ;  Imdur  Current BP meds: Cleviprex  Stable . SBP goal 140-160 per IR . Long-term BP goal normotensive  Hyperlipidemia  Home Lipid lowering medication: Pravachol 20 mg daily  LDL 70, goal < 70  Current lipid lowering medication: none / NPO   Continue statin at discharge  Diabetes  Home diabetic meds: none  Current diabetic meds: insulin  HgbA1c 5.8, goal < 7.0 Recent Labs    12/17/19 2312 12/18/19 0309 12/18/19 0311  GLUCAP 86 46* 81     Other Stroke Risk Factors  Advanced age  Former cigarette smoker - quit  ETOH use, advised to drink no more than 1 alcoholic beverage per day.  Coronary artery disease  OSA  Other Active Problems  Code status - Full code  Aortic Atherosclerosis (ICD10-I70.0)   Emphysema (HKF27-M14.9)  Mild thrombocytopenia  Recent prostate / bladder procedure for hematuria  Recent fall  Parkinson's Disease  Hospital day # 1  This patient is critically ill due to stroke post procedure and at significant risk of neurological worsening, death form severe anemia, bleeding, recurrent stroke, intracranial stenosis. This patient's care requires constant monitoring of vital signs, hemodynamics, respiratory and cardiac monitoring, review of multiple databases, neurological assessment, other specialists and medical decision making of high complexity. I spent 35 minutes of neurocritical care time in the care of this patient   To contact Stroke  Continuity provider, please refer to [email or web address] After hours, contact General Neurology

## 2019-12-18 NOTE — Progress Notes (Signed)
Transported patient to and from MRI without incident.  VS stable throughout, suctioned patient before and after trip, will continue to monitor.

## 2019-12-19 ENCOUNTER — Other Ambulatory Visit: Payer: Self-pay

## 2019-12-19 ENCOUNTER — Encounter (HOSPITAL_COMMUNITY): Payer: Self-pay | Admitting: Radiology

## 2019-12-19 LAB — GLUCOSE, CAPILLARY
Glucose-Capillary: 104 mg/dL — ABNORMAL HIGH (ref 70–99)
Glucose-Capillary: 112 mg/dL — ABNORMAL HIGH (ref 70–99)
Glucose-Capillary: 131 mg/dL — ABNORMAL HIGH (ref 70–99)
Glucose-Capillary: 59 mg/dL — ABNORMAL LOW (ref 70–99)
Glucose-Capillary: 81 mg/dL (ref 70–99)
Glucose-Capillary: 84 mg/dL (ref 70–99)
Glucose-Capillary: 99 mg/dL (ref 70–99)

## 2019-12-19 LAB — BASIC METABOLIC PANEL
Anion gap: 7 (ref 5–15)
BUN: 10 mg/dL (ref 8–23)
CO2: 25 mmol/L (ref 22–32)
Calcium: 8.2 mg/dL — ABNORMAL LOW (ref 8.9–10.3)
Chloride: 107 mmol/L (ref 98–111)
Creatinine, Ser: 1.07 mg/dL (ref 0.61–1.24)
GFR calc Af Amer: >60 mL/min (ref >60)
GFR calc non Af Amer: >60 mL/min (ref >60)
Glucose, Bld: 84 mg/dL (ref 70–99)
Potassium: 3.8 mmol/L (ref 3.5–5.1)
Sodium: 139 mmol/L (ref 135–145)

## 2019-12-19 LAB — CBC
HCT: 36.9 % — ABNORMAL LOW (ref 39.0–52.0)
Hemoglobin: 11.8 g/dL — ABNORMAL LOW (ref 13.0–17.0)
MCH: 33.7 pg (ref 26.0–34.0)
MCHC: 32 g/dL (ref 30.0–36.0)
MCV: 105.4 fL — ABNORMAL HIGH (ref 80.0–100.0)
Platelets: 121 10*3/uL — ABNORMAL LOW (ref 150–400)
RBC: 3.5 MIL/uL — ABNORMAL LOW (ref 4.22–5.81)
RDW: 12.7 % (ref 11.5–15.5)
WBC: 6.1 10*3/uL (ref 4.0–10.5)
nRBC: 0 % (ref 0.0–0.2)

## 2019-12-19 LAB — PHOSPHORUS: Phosphorus: 4.3 mg/dL (ref 2.5–4.6)

## 2019-12-19 LAB — MAGNESIUM: Magnesium: 1.6 mg/dL — ABNORMAL LOW (ref 1.7–2.4)

## 2019-12-19 MED ORDER — LOSARTAN POTASSIUM 50 MG PO TABS
100.0000 mg | ORAL_TABLET | Freq: Every day | ORAL | Status: DC
  Administered 2019-12-19 – 2019-12-22 (×4): 100 mg via ORAL
  Filled 2019-12-19 (×4): qty 2

## 2019-12-19 MED ORDER — MAGNESIUM SULFATE 4 GM/100ML IV SOLN
4.0000 g | Freq: Once | INTRAVENOUS | Status: AC
  Administered 2019-12-19: 4 g via INTRAVENOUS
  Filled 2019-12-19: qty 100

## 2019-12-19 NOTE — Consult Note (Signed)
Physical Medicine and Rehabilitation Consult   Reason for Consult: Stroke with functional deficits. Hx of Parkinson's Referring Physician: Dr. Leonie Man.    HPI: Allen Barnes is a 79 y.o. RH-male with history of T2DM, PD, B12 deficiency, COPD- 2L oxygen at nights  CAD, bladder mass s/p recent TURBT who was admitted on 12/17/19 with reports of syncopal episode with fall and dizziness later that day followed by left facial droop and LUE weakness. CTA/perusion head showed R-MCA core infarct with proximal right M1 large vessel occlusion with short segment reconstitution or R-ICA and continued severe stenosis of terminal R-ICA. He underwent cerebral angio with mechanical thrombectomy of distal L-A3 with recanalization up to A4 segment via left ICA to Acom. Due to heavily calcified occlusive plaque right carotid bulb.  Follow up MRI brain done revealing multifocal R-MCA territory infarcts with abnormal flow within R-ICA cavernous segment c/w chronic R-ICA occlusion. 2D echo showed EF 60-65% with mild LVH and mild MR.  He tolerated extubation yesterday and therapy evaluations initiated. Patient with strong posterior lean with resting tremors UE and difficulty with problem solving. CIR recommended due to functional decline.   Per pt states, recent diagnosis of right Lung cancer,but no mention of this in outpt PCP or cardiology notes  CXR 6/19 neg Review of Systems  HENT: Positive for hearing loss.   Respiratory: Positive for cough and sputum production.   Cardiovascular: Positive for chest pain (at times).  Musculoskeletal: Positive for joint pain (right knee with lateral numbness).     Past Medical History:  Diagnosis Date  . Acquired plantar porokeratosis 11/18/2018  . Anxiety disorder 08/31/2015  . Asthma 08/31/2015  . Atherosclerosis of abdominal aorta ([place]) 02/21/2019   Seen on CT by urologist. 12/2018.  Formatting of this note might be different from the original. Seen on CT by urologist. 12/2018.   Marland Kitchen Atherosclerosis of both carotid arteries 05/18/2018   Right occluded. Left <50% on 04/2018 [place].  Formatting of this note might be different from the original. Right occluded. Left <50% on 04/2018 [place].  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 Calculus of gallbladder without cholecystitis without obstruction 05/18/2018   Asymptomatic. Seen on CT 04/2018  Formatting of this note might be different from the original. Asymptomatic. Seen on CT 04/2018  . Callus of foot 12/11/2015  . Colon polyps 09/03/2015   Overview:  [place]. 2017. Polyps.  Q 3 yrs.  Marland Kitchen COPD (chronic obstructive pulmonary disease) with chronic bronchitis ([place]) 08/31/2015   Overview:  Wears O2 at night.  . 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 30% LAD in 2007  Rare. [place]  . Coronary artery disease involving native coronary artery of native heart without angina pectoris 04/10/2015   Overview:  30% of LAD in 2007  . Coronary artery stenosis 08/31/2015   Overview:  30% LAD in 2007  . Diabetes mellitus type 2, controlled ([place]) 08/31/2015  . Essential hypertension 08/31/2015  . High risk medication use 08/31/2015  . Insomnia 08/31/2015  . Metatarsalgia of right foot 12/11/2015  . Microscopic hematuria 06/02/2019  . Mixed hyperlipidemia 08/31/2015  . Nicotine dependence, uncomplicated 1/47/8295   Overview:  Former smoker.  Uses vaping  Formatting of this note might be different from the original. Former smoker.  Uses vaping  . Pain of right heel 12/02/2018  . Parkinson's disease ([place]) 08/31/2015  . Plantar fasciitis  of right foot 03/11/2016  . 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 Primary osteoarthritis involving multiple joints 09/03/2015  . Sleep apnea 08/31/2015  . Smoking 04/10/2015  . Vitamin B12 deficiency 09/03/2015    Past Surgical History:  Procedure Laterality Date  . BACK SURGERY    .  CATARACT EXTRACTION    . heel Right   . KNEE SURGERY Right   . RADIOLOGY WITH ANESTHESIA N/A 12/17/2019   Procedure: IR WITH ANESTHESIA;  Surgeon: Radiologist, Medication, MD;  Location: [facility];  Service: Radiology;  Laterality: N/A;  . SHOULDER SURGERY Left     Family History  Problem Relation Age of Onset  . Lung cancer Mother   . Heart disease Father       Social History:  Lives alone and independent TPA.  Has cousin who lives next door--checks in daily for medication compliance. Used to work in [employer] --retried 8 yeas ago. He reports that he has quit smoking-- 2 months ago and was using E cigarettes till a month ago. His smoking use included cigarettes. He has a 13.50 pack-year smoking history. He uses smokeless tobacco. He reports current alcohol use of about 1.0 standard drink of alcohol per week. He reports that he does not use drugs.   Allergies  Allergen Reactions  . Carbidopa-Levodopa Nausea And Vomiting    Mainly vomiting  . Tramadol Rash    Medications Prior to Admission  Medication Sig Dispense Refill  . acetaminophen (TYLENOL) 500 MG tablet Take 500 mg by mouth 2 (two) times daily.    Marland Kitchen ALPRAZolam (XANAX) 1 MG tablet Take 1.5 mg by mouth every 8 (eight) hours as needed for anxiety.     Marland Kitchen aspirin EC 81 MG tablet Take 81 mg by mouth at bedtime.     Marland Kitchen BREO ELLIPTA 200-25 MCG/INH AEPB Inhale 1 puff into the lungs daily.     . Cyanocobalamin (RA VITAMIN B-12 TR) 1000 MCG TBCR Take 1 tablet by mouth daily.     Marland Kitchen docusate sodium (COLACE) 50 MG capsule Take 50 mg by mouth as needed for mild constipation.    . Garlic 1696 MG CAPS Take 1 capsule by mouth at bedtime.     . isosorbide mononitrate (IMDUR) 30 MG 24 hr tablet Take 1 tablet (30 mg total) by mouth daily. 90 tablet 3  . levofloxacin (LEVAQUIN) 250 MG tablet Take 250 mg by mouth daily.    Marland Kitchen losartan (COZAAR) 100 MG tablet Take 100 mg by mouth daily.     . OXYGEN Inhale 2 L into the lungs at bedtime.     . pravastatin  (PRAVACHOL) 20 MG tablet Take 20 mg by mouth daily.     . SYMBICORT 160-4.5 MCG/ACT inhaler Inhale 2 puffs into the lungs 2 (two) times daily.     . tamsulosin (FLOMAX) 0.4 MG CAPS capsule 0.4 mg 2 (two) times daily.     Marland Kitchen triamcinolone cream (KENALOG) 0.1 % Apply 1 application topically 2 (two) times daily as needed.    . nitroGLYCERIN (NITROSTAT) 0.4 MG SL tablet Place 1 tablet (0.4 mg total) under the tongue every 5 (five) minutes as needed for chest pain. 25 tablet 1    Home: Home Living Family/patient expects to be discharged to:: Private residence Living Arrangements: Alone Available Help at Discharge: Family, Available 24 hours/day Type of Home: House Home Access: Stairs to enter [employer] of Steps: 2 Entrance Stairs-Rails: Left Home Layout: One level Bathroom Shower/Tub: Tub/shower unit  Bathroom Toilet: Standard Home Equipment: Cane - single point, Grab bars - tub/shower  Functional History: Prior Function Level of Independence: Independent with assistive device(s) Comments: pt ambulates with cane Functional Status:  Mobility: Bed Mobility Overal bed mobility: Needs Assistance Bed Mobility: Supine to Sit Supine to sit: Mod assist Transfers Overall transfer level: Needs assistance Equipment used: Rolling walker (2 wheeled), 1 person hand held assist Transfers: Sit to/from Stand, Stand Pivot Transfers Sit to Stand: Mod assist Stand pivot transfers: Min assist General transfer comment: pt performs 2 sit to stands with modA due to posterior lean, minA with use of RW      ADL:    Cognition: Cognition Overall Cognitive Status: Impaired/Different from baseline Orientation Level: Oriented X4 Cognition Arousal/Alertness: Awake/alert Behavior During Therapy: Flat affect Overall Cognitive Status: Impaired/Different from baseline Area of Impairment: Attention, Memory, Following commands, Safety/judgement, Awareness, Problem solving Current Attention Level:  Sustained Memory: Decreased recall of precautions, Decreased short-term memory Following Commands: Follows one step commands with increased time Safety/Judgement: Decreased awareness of safety, Decreased awareness of deficits Awareness: Emergent Problem Solving: Slow processing, Requires verbal cues, Difficulty sequencing   Blood pressure (!) 173/88, pulse 71, temperature 99.2 F (37.3 C), temperature source Oral, resp. rate (!) 26, height 6' (1.829 m), weight 87.8 kg, SpO2 100 %. Physical Exam  Nursing note and vitals reviewed. Constitutional: He is oriented to person, place, and time.  Very hard of hearing  HENT:  Head: Normocephalic and atraumatic.  Eyes: Pupils are equal, round, and reactive to light. Conjunctivae are normal.  Neck:  Mild pain and limitation range of motion cervical spine  Cardiovascular: Normal rate, regular rhythm and normal heart sounds.  No murmur heard. Respiratory: Effort normal and breath sounds normal. No stridor. No respiratory distress.  GI: Soft. Normal appearance and bowel sounds are normal. He exhibits no distension and no mass. There is no abdominal tenderness.  Musculoskeletal:     Cervical back: Neck supple.  Neurological: He is alert and oriented to person, place, and time.  HOH.  Tangential and needs redirection. Intention tremors BUE.  Motor strength is 5/5 bilateral deltoid bicep tricep grip hip flexor knee extensor ankle dorsiflexor Patient reports upper extremity lower extremity sensation is equal  Left neglect noted on visual field testing  Skin: Skin is warm and dry.  Psychiatric: Mood normal.    Results for orders placed or performed during the hospital encounter of 12/17/19 (from the past 24 hour(s))  Glucose, capillary     Status: None   Collection Time: 12/18/19 11:34 AM  Result Value Ref Range   Glucose-Capillary 85 70 - 99 mg/dL  Glucose, capillary     Status: None   Collection Time: 12/18/19  3:30 PM  Result Value Ref Range     Glucose-Capillary 98 70 - 99 mg/dL  Glucose, capillary     Status: Abnormal   Collection Time: 12/18/19  7:50 PM  Result Value Ref Range   Glucose-Capillary 66 (L) 70 - 99 mg/dL   Comment 1 Notify RN    Comment 2 Document in Chart   Glucose, capillary     Status: Abnormal   Collection Time: 12/18/19  8:25 PM  Result Value Ref Range   Glucose-Capillary 54 (L) 70 - 99 mg/dL   Comment 1 Notify RN    Comment 2 Document in Chart   Glucose, capillary     Status: Abnormal   Collection Time: 12/18/19  8:42 PM  Result Value Ref Range   Glucose-Capillary 103 (  H) 70 - 99 mg/dL  Glucose, capillary     Status: None   Collection Time: 12/19/19 12:01 AM  Result Value Ref Range   Glucose-Capillary 81 70 - 99 mg/dL   Comment 1 Notify RN    Comment 2 Document in Chart   Glucose, capillary     Status: None   Collection Time: 12/19/19  4:09 AM  Result Value Ref Range   Glucose-Capillary 84 70 - 99 mg/dL   Comment 1 Notify RN    Comment 2 Document in Chart   Basic metabolic panel     Status: Abnormal   Collection Time: 12/19/19  5:22 AM  Result Value Ref Range   Sodium 139 135 - 145 mmol/L   Potassium 3.8 3.5 - 5.1 mmol/L   Chloride 107 98 - 111 mmol/L   CO2 25 22 - 32 mmol/L   Glucose, Bld 84 70 - 99 mg/dL   BUN 10 8 - 23 mg/dL   Creatinine, Ser 1.07 0.61 - 1.24 mg/dL   Calcium 8.2 (L) 8.9 - 10.3 mg/dL   GFR calc non Af Amer >60 >60 mL/min   GFR calc Af Amer >60 >60 mL/min   Anion gap 7 5 - 15  CBC     Status: Abnormal   Collection Time: 12/19/19  5:22 AM  Result Value Ref Range   WBC 6.1 4.0 - 10.5 K/uL   RBC 3.50 (L) 4.22 - 5.81 MIL/uL   Hemoglobin 11.8 (L) 13.0 - 17.0 g/dL   HCT 36.9 (L) 39 - 52 %   MCV 105.4 (H) 80.0 - 100.0 fL   MCH 33.7 26.0 - 34.0 pg   MCHC 32.0 30.0 - 36.0 g/dL   RDW 12.7 11.5 - 15.5 %   Platelets 121 (L) 150 - 400 K/uL   nRBC 0.0 0.0 - 0.2 %  Magnesium     Status: Abnormal   Collection Time: 12/19/19  5:22 AM  Result Value Ref Range   Magnesium 1.6  (L) 1.7 - 2.4 mg/dL  Phosphorus     Status: None   Collection Time: 12/19/19  5:22 AM  Result Value Ref Range   Phosphorus 4.3 2.5 - 4.6 mg/dL  Glucose, capillary     Status: Abnormal   Collection Time: 12/19/19  7:55 AM  Result Value Ref Range   Glucose-Capillary 59 (L) 70 - 99 mg/dL   Comment 1 Notify RN    Comment 2 Document in Chart   Glucose, capillary     Status: Abnormal   Collection Time: 12/19/19  9:21 AM  Result Value Ref Range   Glucose-Capillary 104 (H) 70 - 99 mg/dL   CT Code Stroke CTA Head W/WO contrast  Result Date: 12/17/2019 CLINICAL DATA:  Left-sided facial droop.  Abnormal CT scan. EXAM: CT ANGIOGRAPHY HEAD AND NECK CT PERFUSION BRAIN TECHNIQUE: Multidetector CT imaging of the head and neck was performed using the standard protocol during bolus administration of intravenous contrast. Multiplanar CT image reconstructions and MIPs were obtained to evaluate the vascular anatomy. Carotid stenosis measurements (when applicable) are obtained utilizing NASCET criteria, using the distal internal carotid diameter as the denominator. Multiphase CT imaging of the brain was performed following IV bolus contrast injection. Subsequent parametric perfusion maps were calculated using RAPID software. CONTRAST:  12mL OMNIPAQUE IOHEXOL 350 MG/ML SOLN COMPARISON:  CT head without contrast 12/17/2019 FINDINGS: CTA NECK FINDINGS Aortic arch: A 3 vessel arch configuration is present. Atherosclerotic changes are present in the arch and at the origins  of the innominate and subclavian arteries without significant stenosis or aneurysm. Right carotid system: Minimal wall calcifications are present within the right common carotid artery. Dense calcifications are present bifurcation. The right ICA is occluded without reconstitution in the neck. Left carotid system: Left carotid artery demonstrates scattered mural calcifications. Lumen is narrowed to 3.5 mm. No significant stenosis of greater than 50% is  present. Moderate tortuosity is present within the cervical left ICA without other stenosis. Vertebral arteries: The vertebral arteries originate from the subclavian arteries bilaterally. The right vertebral artery is the dominant vessel. Mild proximal stenosis of less than 50% are present bilaterally. No additional stenoses are present in either vertebral artery in the neck. Skeleton: Multilevel degenerative changes are present. No focal lytic or blastic lesions are present. Patient is edentulous. Other neck: Soft tissues the neck are otherwise unremarkable. Salivary glands are within normal limits. No significant adenopathy is present. Thyroid is normal. Upper chest: Centrilobular emphysematous changes are present. Focal nodule or mass lesion is present. Thoracic inlet is normal. Review of the MIP images confirms the above findings CTA HEAD FINDINGS Anterior circulation: Minimal contrast is present in the cavernous right internal carotid artery from the ophthalmic segment to the posterior communicating artery. There is severe narrowing of the terminal right ICA. Atherosclerotic changes are present the cavernous left ICA without focal stenosis through the ICA terminus. The left A1 and M1 segments are normal. The anterior communicating artery is patent. Contrast is present in the right A1 segment. Right M1 segment is occluded. Minimal collaterals are present. Left MCA branch vessels and bilateral ACA branch vessels are within normal limits. Posterior circulation: The right vertebral artery is slightly dominant. PICA origins are visualized and normal. Vertebrobasilar junction is normal. Basilar artery is normal. Left posterior cerebral artery originates from basilar tip. The right posterior cerebral artery is fed by a small right posterior communicating artery and small right P1 segment. PCA branch vessels are intact bilaterally. Venous sinuses: The dural sinuses are patent. The straight sinus and deep cerebral veins  are intact. Cortical veins are unremarkable. No vascular malformation is present. Anatomic variants: Fetal type right posterior cerebral artery remains patent with a small right P1 segment Review of the MIP images confirms the above findings CT Brain Perfusion Findings: ASPECTS: 6/10 CBF (<30%) Volume: 32mL Perfusion (Tmax>6.0s) volume: 144mL Mismatch Volume: 165mL Infarction Location:Right MCA territory IMPRESSION: 1. 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 2. Proximal right M1 large vessel occlusion with minimal collaterals. 3. 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 4. Short-segment reconstitution of the right ICA in the cavernous segment continues to severe stenosis of the terminal right internal carotid artery. 5. The right posterior cerebral artery is fed by a small right posterior communicating artery and small right P1 segment. 6. Aortic Atherosclerosis (ICD10-I70.0) and Emphysema (ICD10-J43.9). 7. Multilevel degenerative changes in the cervical spine. These results were called by telephone at the time of interpretation on 12/17/2019 at 2:35 pm to provider Rory Percy, who verbally acknowledged these results. Electronically Signed   By: San Morelle M.D.   On: 12/17/2019 15:10   CT Code Stroke CTA Neck W/WO contrast  Result Date: 12/17/2019 CLINICAL DATA:  Left-sided facial droop.  Abnormal CT scan. EXAM: CT ANGIOGRAPHY HEAD AND NECK CT PERFUSION BRAIN TECHNIQUE: Multidetector CT imaging of the head and neck was performed using the standard protocol during bolus administration of intravenous contrast. Multiplanar CT image reconstructions and MIPs were obtained to evaluate the vascular  anatomy. Carotid stenosis measurements (when applicable) are obtained utilizing NASCET criteria, using the distal internal carotid diameter as the denominator. Multiphase CT imaging of the brain was performed following IV bolus contrast injection. Subsequent parametric perfusion  maps were calculated using RAPID software. CONTRAST:  18mL OMNIPAQUE IOHEXOL 350 MG/ML SOLN COMPARISON:  CT head without contrast 12/17/2019 FINDINGS: CTA NECK FINDINGS Aortic arch: A 3 vessel arch configuration is present. Atherosclerotic changes are present in the arch and at the origins of the innominate and subclavian arteries without significant stenosis or aneurysm. Right carotid system: Minimal wall calcifications are present within the right common carotid artery. Dense calcifications are present bifurcation. The right ICA is occluded without reconstitution in the neck. Left carotid system: Left carotid artery demonstrates scattered mural calcifications. Lumen is narrowed to 3.5 mm. No significant stenosis of greater than 50% is present. Moderate tortuosity is present within the cervical left ICA without other stenosis. Vertebral arteries: The vertebral arteries originate from the subclavian arteries bilaterally. The right vertebral artery is the dominant vessel. Mild proximal stenosis of less than 50% are present bilaterally. No additional stenoses are present in either vertebral artery in the neck. Skeleton: Multilevel degenerative changes are present. No focal lytic or blastic lesions are present. Patient is edentulous. Other neck: Soft tissues the neck are otherwise unremarkable. Salivary glands are within normal limits. No significant adenopathy is present. Thyroid is normal. Upper chest: Centrilobular emphysematous changes are present. Focal nodule or mass lesion is present. Thoracic inlet is normal. Review of the MIP images confirms the above findings CTA HEAD FINDINGS Anterior circulation: Minimal contrast is present in the cavernous right internal carotid artery from the ophthalmic segment to the posterior communicating artery. There is severe narrowing of the terminal right ICA. Atherosclerotic changes are present the cavernous left ICA without focal stenosis through the ICA terminus. The left A1  and M1 segments are normal. The anterior communicating artery is patent. Contrast is present in the right A1 segment. Right M1 segment is occluded. Minimal collaterals are present. Left MCA branch vessels and bilateral ACA branch vessels are within normal limits. Posterior circulation: The right vertebral artery is slightly dominant. PICA origins are visualized and normal. Vertebrobasilar junction is normal. Basilar artery is normal. Left posterior cerebral artery originates from basilar tip. The right posterior cerebral artery is fed by a small right posterior communicating artery and small right P1 segment. PCA branch vessels are intact bilaterally. Venous sinuses: The dural sinuses are patent. The straight sinus and deep cerebral veins are intact. Cortical veins are unremarkable. No vascular malformation is present. Anatomic variants: Fetal type right posterior cerebral artery remains patent with a small right P1 segment Review of the MIP images confirms the above findings CT Brain Perfusion Findings: ASPECTS: 6/10 CBF (<30%) Volume: 74mL Perfusion (Tmax>6.0s) volume: 19mL Mismatch Volume: 18mL Infarction Location:Right MCA territory IMPRESSION: 1. 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 2. Proximal right M1 large vessel occlusion with minimal collaterals. 3. 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 4. Short-segment reconstitution of the right ICA in the cavernous segment continues to severe stenosis of the terminal right internal carotid artery. 5. The right posterior cerebral artery is fed by a small right posterior communicating artery and small right P1 segment. 6. Aortic Atherosclerosis (ICD10-I70.0) and Emphysema (ICD10-J43.9). 7. Multilevel degenerative changes in the cervical spine. These results were called by telephone at the time of interpretation on 12/17/2019 at 2:35 pm to provider Rory Percy, who verbally acknowledged  these results. Electronically Signed   By:  San Morelle M.D.   On: 12/17/2019 15:10   MR BRAIN WO CONTRAST  Result Date: 12/18/2019 CLINICAL DATA:  Follow-up examination for acute stroke. Status post catheter directed intervention. EXAM: MRI HEAD WITHOUT CONTRAST TECHNIQUE: Multiplanar, multiecho pulse sequences of the brain and surrounding structures were obtained without intravenous contrast. COMPARISON:  Prior studies from 12/17/2019. FINDINGS: Brain: Generalized age-related cerebral atrophy. Scattered patchy T2/FLAIR hyperintensity within the periventricular and deep white matter both cerebral hemispheres most consistent with chronic small vessel ischemic disease, mild in nature. Patchy multifocal areas of restricted diffusion seen involving the right MCA distribution, primarily involving the right insula and adjacent right temporal lobe, with extension to involve the posterior right frontoparietal region. Involvement of the right caudate and lentiform nuclei present as well. No associated hemorrhage or mass effect. Scattered FLAIR hyperintensity seen within several cortical sulci within the area of infarction without susceptibility artifact, likely related to vascular congestion. Few additional punctate foci of diffusion abnormality noted at the cortical and subcortical posterior left frontal region (series 5, image 88). No other areas of acute or subacute ischemia. Gray-white matter differentiation otherwise maintained. No evidence for acute intracranial hemorrhage. Vague FLAIR signal intensity partially surrounding the midbrain and pons noted without associated SWI correlate, favored to be artifactual in nature. Single punctate chronic microhemorrhage noted within the left frontal centrum semi ovale, of doubtful significance in isolation. No mass lesion, midline shift or mass effect. No hydrocephalus. No extra-axial fluid collection. Pituitary gland suprasellar region within normal limits. Midline structures intact. Vascular: Abnormal flow  void within the right ICA to the cavernous segment, consistent with known chronic right ICA occlusion. Otherwise, major intracranial vascular flow voids are maintained, with particular note made of preserved flow voids within the right MCA distribution. Skull and upper cervical spine: Craniocervical junction within normal limits. Degenerative spondylosis noted at C3-4 with resultant mild spinal stenosis. Bone marrow signal intensity within normal limits. No scalp soft tissue abnormality. Sinuses/Orbits: Patient status post bilateral ocular lens replacement. Scattered mucosal thickening noted throughout the paranasal sinuses, inflammatory/allergic in nature and likely chronic. No evidence for acute sinusitis. No mastoid effusion. Inner ear structures grossly normal. Other: None. IMPRESSION: 1. Patchy multifocal acute ischemic right MCA territory infarcts as above. No associated hemorrhage or mass effect. 2. Abnormal flow void within the right ICA to the cavernous segment, consistent with known chronic right ICA occlusion. Major intracranial vascular flow voids are otherwise maintained. 3. Underlying age-related cerebral atrophy with mild chronic small vessel ischemic disease. Electronically Signed   By: Jeannine Boga M.D.   On: 12/18/2019 04:30   CT C-SPINE NO CHARGE  Result Date: 12/17/2019 CLINICAL DATA:  Headache, neurologic deficit EXAM: CT CERVICAL SPINE WITHOUT CONTRAST TECHNIQUE: Multidetector CT imaging of the cervical spine was performed without intravenous contrast. Multiplanar CT image reconstructions were also generated. COMPARISON:  10/23/2016 x-ray, 09/04/2011 and 08/29/2019 chest CT FINDINGS: Alignment: No facet joint dislocation. Dens and lateral masses are aligned. No traumatic listhesis. Skull base and vertebrae: No acute fracture. No primary bone lesion or focal pathologic process. Soft tissues and spinal canal: No prevertebral fluid or swelling. No visible canal hematoma. Disc levels:  Advanced multilevel disc height loss with degenerative endplate spurring. Severe multilevel left-sided facet arthropathy with suggestion of severe left-sided foraminal stenosis at C3-4, C4-5, and C5-6. Upper chest: Centrilobular and paraseptal emphysema within the lung apices. There are a few small 2-3 mm right upper lobe pulmonary nodules, stable from 09/04/2011. Mildly  enlarged AP window lymph node, stable from 08/29/2019. Other: Aortic and carotid atherosclerotic calcification. IMPRESSION: 1. No acute fracture or traumatic listhesis of the cervical spine. 2. 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 3. Emphysema and aortic atherosclerosis. Aortic Atherosclerosis (ICD10-I70.0) and Emphysema (ICD10-J43.9). Electronically Signed   By: Davina Poke D.O.   On: 12/17/2019 15:18   CT Code Stroke Cerebral Perfusion with contrast  Result Date: 12/17/2019 CLINICAL DATA:  Left-sided facial droop.  Abnormal CT scan. EXAM: CT ANGIOGRAPHY HEAD AND NECK CT PERFUSION BRAIN TECHNIQUE: Multidetector CT imaging of the head and neck was performed using the standard protocol during bolus administration of intravenous contrast. Multiplanar CT image reconstructions and MIPs were obtained to evaluate the vascular anatomy. Carotid stenosis measurements (when applicable) are obtained utilizing NASCET criteria, using the distal internal carotid diameter as the denominator. Multiphase CT imaging of the brain was performed following IV bolus contrast injection. Subsequent parametric perfusion maps were calculated using RAPID software. CONTRAST:  173mL OMNIPAQUE IOHEXOL 350 MG/ML SOLN COMPARISON:  CT head without contrast 12/17/2019 FINDINGS: CTA NECK FINDINGS Aortic arch: A 3 vessel arch configuration is present. Atherosclerotic changes are present in the arch and at the origins of the innominate and subclavian arteries  without significant stenosis or aneurysm. Right carotid system: Minimal wall calcifications are present within the right common carotid artery. Dense calcifications are present bifurcation. The right ICA is occluded without reconstitution in the neck. Left carotid system: Left carotid artery demonstrates scattered mural calcifications. Lumen is narrowed to 3.5 mm. No significant stenosis of greater than 50% is present. Moderate tortuosity is present within the cervical left ICA without other stenosis. Vertebral arteries: The vertebral arteries originate from the subclavian arteries bilaterally. The right vertebral artery is the dominant vessel. Mild proximal stenosis of less than 50% are present bilaterally. No additional stenoses are present in either vertebral artery in the neck. Skeleton: Multilevel degenerative changes are present. No focal lytic or blastic lesions are present. Patient is edentulous. Other neck: Soft tissues the neck are otherwise unremarkable. Salivary glands are within normal limits. No significant adenopathy is present. Thyroid is normal. Upper chest: Centrilobular emphysematous changes are present. Focal nodule or mass lesion is present. Thoracic inlet is normal. Review of the MIP images confirms the above findings CTA HEAD FINDINGS Anterior circulation: Minimal contrast is present in the cavernous right internal carotid artery from the ophthalmic segment to the posterior communicating artery. There is severe narrowing of the terminal right ICA. Atherosclerotic changes are present the cavernous left ICA without focal stenosis through the ICA terminus. The left A1 and M1 segments are normal. The anterior communicating artery is patent. Contrast is present in the right A1 segment. Right M1 segment is occluded. Minimal collaterals are present. Left MCA branch vessels and bilateral ACA branch vessels are within normal limits. Posterior circulation: The right vertebral artery is slightly  dominant. PICA origins are visualized and normal. Vertebrobasilar junction is normal. Basilar artery is normal. Left posterior cerebral artery originates from basilar tip. The right posterior cerebral artery is fed by a small right posterior communicating artery and small right P1 segment. PCA branch vessels are intact bilaterally. Venous sinuses: The dural sinuses are patent. The straight sinus and deep cerebral veins are intact. Cortical veins are unremarkable. No vascular malformation is present. Anatomic variants: Fetal type right posterior cerebral artery remains patent with a small right P1 segment Review of the MIP  images confirms the above findings CT Brain Perfusion Findings: ASPECTS: 6/10 CBF (<30%) Volume: 22mL Perfusion (Tmax>6.0s) volume: 187mL Mismatch Volume: 124mL Infarction Location:Right MCA territory IMPRESSION: 1. 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 2. Proximal right M1 large vessel occlusion with minimal collaterals. 3. 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 4. Short-segment reconstitution of the right ICA in the cavernous segment continues to severe stenosis of the terminal right internal carotid artery. 5. The right posterior cerebral artery is fed by a small right posterior communicating artery and small right P1 segment. 6. Aortic Atherosclerosis (ICD10-I70.0) and Emphysema (ICD10-J43.9). 7. Multilevel degenerative changes in the cervical spine. These results were called by telephone at the time of interpretation on 12/17/2019 at 2:35 pm to provider Rory Percy, who verbally acknowledged these results. Electronically Signed   By: San Morelle M.D.   On: 12/17/2019 15:10   DG CHEST PORT 1 VIEW  Result Date: 12/17/2019 CLINICAL DATA:  Intubation. EXAM: PORTABLE CHEST 1 VIEW COMPARISON:  Radiograph 12/05/2019, chest CT 08/29/2019 FINDINGS: Endotracheal tube tip is 5.9 cm from the carina. Tip and side port of the enteric tube below the  diaphragm in the stomach, the tube may be kinked at the side port. Cardiomegaly with slight increase from prior exam. Chronic interstitial coarsening. Chronic right basilar pleural calcifications and blunting of the costophrenic angle. No new airspace disease. No pneumothorax. IMPRESSION: 1. Endotracheal tube tip 5.9 cm from the carina. 2. Enteric tube tip and side port below the diaphragm in the stomach, the tube may be kinked at the side port. 3. Slight increase in cardiomegaly from prior. Chronic interstitial coarsening. Electronically Signed   By: Keith Rake M.D.   On: 12/17/2019 20:58   CT HEAD CODE STROKE WO CONTRAST  Result Date: 12/17/2019 CLINICAL DATA:  Code stroke. Left facial droop. Right-sided gaze. Last seen normal at 9 a.m. EXAM: CT HEAD WITHOUT CONTRAST TECHNIQUE: Contiguous axial images were obtained from the base of the skull through the vertex without intravenous contrast. COMPARISON:  CT head without contrast 12/05/2019 at [facility]. FINDINGS: Brain: Acute nonhemorrhagic infarct is present in the right lentiform nucleus, crossing the internal capsule and involving the right caudate head. Infarct involves the posterior left scratched at the infarct involves posterior right lentiform nucleus and portions of the right temporal lobe. Anterior insular cortex is intact. No additional cortical abnormalities are present over the convexity. Left basal ganglia is within normal limits. Moderate white matter changes are present bilaterally. No acute hemorrhage or mass lesion is present. The ventricles are of normal size. The brainstem and cerebellum are within normal limits. No significant extraaxial fluid collection is present. Vascular: Atherosclerotic calcifications are present within the cavernous internal carotid arteries bilaterally. No hyperdense vessel is present. Skull: Calvarium is intact. No focal lytic or blastic lesions are present. No significant extracranial soft tissue lesion  is present. Sinuses/Orbits: Benign chondroid lesion or ostium of the right frontal sinus is stable. The paranasal sinuses and mastoid air cells are otherwise clear. Bilateral lens replacements are noted. Globes and orbits are otherwise unremarkable. ASPECTS [facility] Stroke Program Early CT Score) - Ganglionic level infarction (caudate, lentiform nuclei, internal capsule, insula, M1-M3 cortex): 3/7 - Supraganglionic infarction (M4-M6 cortex): 3/3 Total score (0-10 with 10 being normal): 6/10 IMPRESSION: 1. Acute nonhemorrhagic infarct involving the right lentiform nucleus, crossing the internal capsule and portions of the right caudate head. 2. Acute nonhemorrhagic infarct involving the posterior right insular ribbon. Infarct may involve portions  of the temporal lobe. 3. ASPECTS is 6/10. 4. Stable moderate white matter disease. The above was relayed via text pager to Dr. Rory Percy on 12/17/2019 at 14:06 . Electronically Signed   By: San Morelle M.D.   On: 12/17/2019 14:08   ECHOCARDIOGRAM LIMITED  Result Date: 12/18/2019    ECHOCARDIOGRAM LIMITED REPORT   Patient Name:   TAREK CRAVENS Date of Exam: 12/18/2019 Medical Rec #:  [id number]    Height:       72.0 in Accession #:    [id number]   Weight:       193.6 lb Date of Birth:  January 11, 1941    BSA:          2.101 m Patient Age:    83 years     BP:           122/62 mmHg Patient Gender: M            HR:           53 bpm. Exam Location:  Inpatient Procedure: Limited Color Doppler, Cardiac Doppler and Limited Echo Indications:    stroke 434.91  History:        Patient has prior history of Echocardiogram examinations, most                 recent 09/30/2019. CAD, COPD and Parkinsons; Risk                 Factors:Hypertension, Dyslipidemia and Sleep Apnea.  Sonographer:    Johny Chess Referring Phys: [id number] ASHISH ARORA IMPRESSIONS  1. Left ventricular ejection fraction, by estimation, is 60 to 65%. The left ventricle has normal function. The left ventricle has no  regional wall motion abnormalities. There is mild concentric left ventricular hypertrophy. Left ventricular diastolic parameters are consistent with Grade I diastolic dysfunction (impaired relaxation).  2. Right ventricular systolic function is normal. The right ventricular size is normal.  3. The mitral valve is normal in structure. Mild mitral valve regurgitation. No evidence of mitral stenosis.  4. The aortic valve is normal in structure. Aortic valve regurgitation is not visualized. No aortic stenosis is present.  5. The inferior vena cava is dilated in size with <50% respiratory variability, suggesting right atrial pressure of 15 mmHg. Conclusion(s)/Recommendation(s): No intracardiac source of embolism detected on this transthoracic study. A transesophageal echocardiogram is recommended to exclude cardiac source of embolism if clinically indicated. FINDINGS  Left Ventricle: Left ventricular ejection fraction, by estimation, is 60 to 65%. The left ventricle has normal function. The left ventricle has no regional wall motion abnormalities. The left ventricular internal cavity size was normal in size. There is  mild concentric left ventricular hypertrophy. Normal left ventricular filling pressure. Right Ventricle: The right ventricular size is normal. No increase in right ventricular wall thickness. Right ventricular systolic function is normal. Left Atrium: Left atrial size was normal in size. Right Atrium: Right atrial size was normal in size. Pericardium: There is no evidence of pericardial effusion. Mitral Valve: The mitral valve is normal in structure. There is moderate thickening of the mitral valve leaflet(s). Normal mobility of the mitral valve leaflets. Moderate mitral annular calcification. Mild mitral valve regurgitation, with centrally-directed jet. No evidence of mitral valve stenosis. Tricuspid Valve: The tricuspid valve is normal in structure. Tricuspid valve regurgitation is trivial. No evidence of  tricuspid stenosis. Aortic Valve: The aortic valve is normal in structure. Aortic valve regurgitation is not visualized. No aortic stenosis is present. Pulmonic Valve: The pulmonic valve was  normal in structure. Pulmonic valve regurgitation is not visualized. No evidence of pulmonic stenosis. Aorta: The aortic root is normal in size and structure. Venous: The inferior vena cava is dilated in size with less than 50% respiratory variability, suggesting right atrial pressure of 15 mmHg. IAS/Shunts: No atrial level shunt detected by color flow Doppler.  LEFT VENTRICLE PLAX 2D LVIDd:         4.40 cm  Diastology LVIDs:         2.70 cm  LV e' lateral:   9.14 cm/s LV PW:         1.10 cm  LV E/e' lateral: 10.9 LV IVS:        0.90 cm  LV e' medial:    8.05 cm/s LVOT diam:     1.90 cm  LV E/e' medial:  12.3 LV SV:         93 LV SV Index:   44 LVOT Area:     2.84 cm  IVC IVC diam: 2.50 cm LEFT ATRIUM         Index LA diam:    3.10 cm 1.48 cm/m  AORTIC VALVE LVOT Vmax:   123.00 cm/s LVOT Vmean:  81.200 cm/s LVOT VTI:    0.328 m  AORTA Ao Root diam: 3.40 cm MITRAL VALVE MV Area (PHT): 3.27 cm    SHUNTS MV Decel Time: 232 msec    Systemic VTI:  0.33 m MV E velocity: 99.40 cm/s  Systemic Diam: 1.90 cm MV A velocity: 90.40 cm/s MV E/A ratio:  1.10 Ena Dawley MD Electronically signed by Ena Dawley MD Signature Date/Time: 12/18/2019/4:32:25 PM    Final      Assessment/Plan: Diagnosis: Left neglect and decreased balance secondary to right MCA distribution infarct status post revascularization 1. Does the need for close, 24 hr/day medical supervision in concert with the patient's rehab needs make it unreasonable for this patient to be served in a less intensive setting? Yes 2. Co-Morbidities requiring supervision/potential complications: Type 2 diabetes, Parkinson's disease, 3. Due to bladder management, bowel management, safety, skin/wound care, disease management, medication administration, pain management and  patient education, does the patient require 24 hr/day rehab nursing? Yes 4. Does the patient require coordinated care of a physician, rehab nurse, therapy disciplines of PT, OT, speech to address physical and functional deficits in the context of the above medical diagnosis(es)? Yes Addressing deficits in the following areas: balance, endurance, locomotion, strength, transferring, bowel/bladder control, bathing, dressing, feeding, toileting, cognition and psychosocial support 5. Can the patient actively participate in an intensive therapy program of at least 3 hrs of therapy per day at least 5 days per week? Yes 6. The potential for patient to make measurable gains while on inpatient rehab is good 7. Anticipated functional outcomes upon discharge from inpatient rehab are supervision  with PT, supervision with OT, supervision with SLP. 8. Estimated rehab length of stay to reach the above functional goals is: 14 to 16 days 9. Anticipated discharge destination: Home 10. Overall Rehab/Functional Prognosis: good  RECOMMENDATIONS: This patient's condition is appropriate for continued rehabilitative care in the following setting: CIR Patient has agreed to participate in recommended program. Yes Note that insurance prior authorization may be required for reimbursement for recommended care.  Comment:    Bary Leriche, PA-C 12/19/2019  "I have personally performed a face to face diagnostic evaluation of this patient.  Additionally, I have reviewed and concur with the physician assistant's documentation above." Charlett Blake M.D. [facility] FAAPM&[id number] (Neuromuscular Med) Diplomate Am Board of Electrodiagnostic Med Fellow Am Board of Interventional Pain

## 2019-12-19 NOTE — Progress Notes (Signed)
Patient belonging from security return to patient.  Belongings include wallet, two sets of keys and pocket knife.  Two hundred dollars bills found in wallet not listed in security slip.  Two RNs verified and patient's brother present.  Security slip updated and patient's brother signed updated slip.  Patient verified belongings.

## 2019-12-19 NOTE — Progress Notes (Signed)
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 Karenann Cai. Patient awake and alert sitting in bed watching TV with no complaints at this time. Accompanied by brother at bedside. Can spontaneously move all extremities. Right groin incision c/d/i.   Allergies: Carbidopa-levodopa and Tramadol  Medications: Prior to Admission medications   Medication Sig Start Date End Date Taking? Authorizing Provider  acetaminophen (TYLENOL) 500 MG tablet Take 500 mg by mouth 2 (two) times daily.   Yes [provider]  ALPRAZolam Duanne Moron) 1 MG tablet Take 1.5 mg by mouth every 8 (eight) hours as needed for anxiety.  04/04/16  Yes [provider]  aspirin EC 81 MG tablet Take 81 mg by mouth at bedtime.    Yes [provider]  BREO ELLIPTA 200-25 MCG/INH AEPB Inhale 1 puff into the lungs daily.  07/04/19  Yes [provider]  Cyanocobalamin (RA VITAMIN B-12 TR) 1000 MCG TBCR Take 1 tablet by mouth daily.    Yes [provider]  docusate sodium (COLACE) 50 MG capsule Take 50 mg by mouth as needed for mild constipation.   Yes [provider]  Garlic 4967 MG CAPS Take 1 capsule by mouth at bedtime.    Yes [provider]  isosorbide mononitrate (IMDUR) 30 MG 24 hr tablet Take 1 tablet (30 mg total) by mouth daily. 10/14/19 01/12/20 Yes Park Liter, MD  levofloxacin (LEVAQUIN) 250 MG tablet Take 250 mg by mouth daily. 12/15/19  Yes [provider]  losartan (COZAAR) 100 MG tablet Take 100 mg by mouth daily.  04/03/16  Yes [provider]  OXYGEN Inhale 2 L into the lungs at bedtime.    Yes [provider]    pravastatin (PRAVACHOL) 20 MG tablet Take 20 mg by mouth daily.  09/03/15  Yes [provider]  SYMBICORT 160-4.5 MCG/ACT inhaler Inhale 2 puffs into the lungs 2 (two) times daily.  10/05/19  Yes [provider]  tamsulosin (FLOMAX) 0.4 MG CAPS capsule 0.4 mg 2 (two) times daily.  03/25/16  Yes [provider]  triamcinolone cream (KENALOG) 0.1 % Apply 1 application topically 2 (two) times daily as needed. 11/30/19  Yes [provider]  nitroGLYCERIN (NITROSTAT) 0.4 MG SL tablet Place 1 tablet (0.4 mg total) under the tongue every 5 (five) minutes as needed for chest pain. 07/06/19 10/04/19  Park Liter, MD     Vital Signs: BP (!) 173/88   Pulse 71   Temp 99.2 F (37.3 C) (Oral)   Resp (!) 26   Ht 6' (1.829 m)   Wt 193 lb 9 oz (87.8 kg)   SpO2 100%   BMI 26.25 kg/m   Physical Exam Vitals and nursing note reviewed.  Constitutional:      General: He is not in acute distress.    Appearance: Normal appearance.  Pulmonary:     Effort: Pulmonary effort is normal. No respiratory distress.  Skin:    General: Skin is warm and dry.     Comments: Right groin incision soft without active bleeding or hematoma.  Neurological:     Mental Status: He is alert.     Comments: Alert, awake, and oriented x3. Speech and comprehension intact.  PERRL bilaterally. EOMs intact bilaterally without nystagmus or subjective diplopia. No facial asymmetry. Tongue midline. Can spontaneously move all extremities. No pronator drift. Distal pulses (DPs) 2+ bilaterally.     Imaging: CT Code Stroke CTA Head W/WO contrast  Result Date: 12/17/2019 CLINICAL DATA:  Left-sided facial droop.  Abnormal CT scan. EXAM: CT ANGIOGRAPHY HEAD AND NECK CT PERFUSION BRAIN TECHNIQUE: Multidetector CT imaging of the head and neck was performed using the standard protocol during bolus administration of intravenous contrast. Multiplanar CT image reconstructions and MIPs were obtained to  evaluate the vascular anatomy. Carotid stenosis measurements (when applicable) are obtained utilizing NASCET criteria, using the distal internal carotid diameter as the denominator. Multiphase CT imaging of the brain was performed following IV bolus contrast injection. Subsequent parametric perfusion maps were calculated using RAPID software. CONTRAST:  119mL OMNIPAQUE IOHEXOL 350 MG/ML SOLN COMPARISON:  CT head without contrast 12/17/2019 FINDINGS: CTA NECK FINDINGS Aortic arch: A 3 vessel arch configuration is present. Atherosclerotic changes are present in the arch and at the origins of the innominate and subclavian arteries without significant stenosis or aneurysm. Right carotid system: Minimal wall calcifications are present within the right common carotid artery. Dense calcifications are present bifurcation. The right ICA is occluded without reconstitution in the neck. Left carotid system: Left carotid artery demonstrates scattered mural calcifications. Lumen is narrowed to 3.5 mm. No significant stenosis of greater than 50% is present. Moderate tortuosity is present within the cervical left ICA without other stenosis. Vertebral arteries: The vertebral arteries originate from the subclavian arteries bilaterally. The right vertebral artery is the dominant vessel. Mild proximal stenosis of less than 50% are present bilaterally. No additional stenoses are present in either vertebral artery in the neck. Skeleton: Multilevel degenerative changes are present. No focal lytic or blastic lesions are present. Patient is edentulous. Other neck: Soft tissues the neck are otherwise unremarkable. Salivary glands are within normal limits. No significant adenopathy is present. Thyroid is normal. Upper chest: Centrilobular emphysematous changes are present. Focal nodule or mass lesion is present. Thoracic inlet is normal. Review of the MIP images confirms the above findings CTA HEAD FINDINGS Anterior circulation: Minimal  contrast is present in the cavernous right internal carotid artery from the ophthalmic segment to the posterior communicating artery. There is severe narrowing of the terminal right ICA. Atherosclerotic changes are present the cavernous left ICA without focal stenosis through the ICA terminus. The left A1 and M1 segments are normal. The anterior communicating artery is patent. Contrast is present in the right A1 segment. Right M1 segment is occluded. Minimal collaterals are present. Left MCA branch vessels and bilateral ACA branch vessels are within normal limits. Posterior circulation: The right vertebral artery is slightly dominant. PICA origins are visualized and normal. Vertebrobasilar junction is normal. Basilar artery is normal. Left posterior cerebral artery originates from basilar tip. The right posterior cerebral artery is fed by a small right posterior communicating artery and small right P1 segment. PCA branch vessels are intact bilaterally. Venous sinuses: The dural sinuses are patent. The straight sinus and deep cerebral veins are intact. Cortical veins are unremarkable. No vascular malformation is present. Anatomic variants: Fetal type right posterior cerebral artery remains patent with a small right P1 segment Review of the MIP images confirms the above findings CT Brain Perfusion Findings: ASPECTS: 6/10 CBF (<30%) Volume: 73mL Perfusion (Tmax>6.0s) volume: 174mL Mismatch Volume: 147mL Infarction Location:Right MCA territory IMPRESSION: 1. 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 2. Proximal right M1  large vessel occlusion with minimal collaterals. 3. 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 4. Short-segment reconstitution of the right ICA in the cavernous segment continues to severe stenosis of the terminal right internal carotid artery. 5. The right posterior cerebral artery is fed by a small right posterior communicating artery and small right P1 segment. 6.  Aortic Atherosclerosis (ICD10-I70.0) and Emphysema (ICD10-J43.9). 7. Multilevel degenerative changes in the cervical spine. These results were called by telephone at the time of interpretation on 12/17/2019 at 2:35 pm to provider Rory Percy, who verbally acknowledged these results. Electronically Signed   By: San Morelle M.D.   On: 12/17/2019 15:10   CT Code Stroke CTA Neck W/WO contrast  Result Date: 12/17/2019 CLINICAL DATA:  Left-sided facial droop.  Abnormal CT scan. EXAM: CT ANGIOGRAPHY HEAD AND NECK CT PERFUSION BRAIN TECHNIQUE: Multidetector CT imaging of the head and neck was performed using the standard protocol during bolus administration of intravenous contrast. Multiplanar CT image reconstructions and MIPs were obtained to evaluate the vascular anatomy. Carotid stenosis measurements (when applicable) are obtained utilizing NASCET criteria, using the distal internal carotid diameter as the denominator. Multiphase CT imaging of the brain was performed following IV bolus contrast injection. Subsequent parametric perfusion maps were calculated using RAPID software. CONTRAST:  166mL OMNIPAQUE IOHEXOL 350 MG/ML SOLN COMPARISON:  CT head without contrast 12/17/2019 FINDINGS: CTA NECK FINDINGS Aortic arch: A 3 vessel arch configuration is present. Atherosclerotic changes are present in the arch and at the origins of the innominate and subclavian arteries without significant stenosis or aneurysm. Right carotid system: Minimal wall calcifications are present within the right common carotid artery. Dense calcifications are present bifurcation. The right ICA is occluded without reconstitution in the neck. Left carotid system: Left carotid artery demonstrates scattered mural calcifications. Lumen is narrowed to 3.5 mm. No significant stenosis of greater than 50% is present. Moderate tortuosity is present within the cervical left ICA without other stenosis. Vertebral arteries: The vertebral arteries originate  from the subclavian arteries bilaterally. The right vertebral artery is the dominant vessel. Mild proximal stenosis of less than 50% are present bilaterally. No additional stenoses are present in either vertebral artery in the neck. Skeleton: Multilevel degenerative changes are present. No focal lytic or blastic lesions are present. Patient is edentulous. Other neck: Soft tissues the neck are otherwise unremarkable. Salivary glands are within normal limits. No significant adenopathy is present. Thyroid is normal. Upper chest: Centrilobular emphysematous changes are present. Focal nodule or mass lesion is present. Thoracic inlet is normal. Review of the MIP images confirms the above findings CTA HEAD FINDINGS Anterior circulation: Minimal contrast is present in the cavernous right internal carotid artery from the ophthalmic segment to the posterior communicating artery. There is severe narrowing of the terminal right ICA. Atherosclerotic changes are present the cavernous left ICA without focal stenosis through the ICA terminus. The left A1 and M1 segments are normal. The anterior communicating artery is patent. Contrast is present in the right A1 segment. Right M1 segment is occluded. Minimal collaterals are present. Left MCA branch vessels and bilateral ACA branch vessels are within normal limits. Posterior circulation: The right vertebral artery is slightly dominant. PICA origins are visualized and normal. Vertebrobasilar junction is normal. Basilar artery is normal. Left posterior cerebral artery originates from basilar tip. The right posterior cerebral artery is fed by a small right posterior communicating artery and small right P1 segment. PCA branch vessels are intact bilaterally. Venous sinuses: The dural sinuses are  patent. The straight sinus and deep cerebral veins are intact. Cortical veins are unremarkable. No vascular malformation is present. Anatomic variants: Fetal type right posterior cerebral artery  remains patent with a small right P1 segment Review of the MIP images confirms the above findings CT Brain Perfusion Findings: ASPECTS: 6/10 CBF (<30%) Volume: 61mL Perfusion (Tmax>6.0s) volume: 177mL Mismatch Volume: 142mL Infarction Location:Right MCA territory IMPRESSION: 1. 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 2. Proximal right M1 large vessel occlusion with minimal collaterals. 3. 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 4. Short-segment reconstitution of the right ICA in the cavernous segment continues to severe stenosis of the terminal right internal carotid artery. 5. The right posterior cerebral artery is fed by a small right posterior communicating artery and small right P1 segment. 6. Aortic Atherosclerosis (ICD10-I70.0) and Emphysema (ICD10-J43.9). 7. Multilevel degenerative changes in the cervical spine. These results were called by telephone at the time of interpretation on 12/17/2019 at 2:35 pm to provider Rory Percy, who verbally acknowledged these results. Electronically Signed   By: San Morelle M.D.   On: 12/17/2019 15:10   MR BRAIN WO CONTRAST  Result Date: 12/18/2019 CLINICAL DATA:  Follow-up examination for acute stroke. Status post catheter directed intervention. EXAM: MRI HEAD WITHOUT CONTRAST TECHNIQUE: Multiplanar, multiecho pulse sequences of the brain and surrounding structures were obtained without intravenous contrast. COMPARISON:  Prior studies from 12/17/2019. FINDINGS: Brain: Generalized age-related cerebral atrophy. Scattered patchy T2/FLAIR hyperintensity within the periventricular and deep white matter both cerebral hemispheres most consistent with chronic small vessel ischemic disease, mild in nature. Patchy multifocal areas of restricted diffusion seen involving the right MCA distribution, primarily involving the right insula and adjacent right temporal lobe, with extension to involve the posterior right frontoparietal region.  Involvement of the right caudate and lentiform nuclei present as well. No associated hemorrhage or mass effect. Scattered FLAIR hyperintensity seen within several cortical sulci within the area of infarction without susceptibility artifact, likely related to vascular congestion. Few additional punctate foci of diffusion abnormality noted at the cortical and subcortical posterior left frontal region (series 5, image 88). No other areas of acute or subacute ischemia. Gray-white matter differentiation otherwise maintained. No evidence for acute intracranial hemorrhage. Vague FLAIR signal intensity partially surrounding the midbrain and pons noted without associated SWI correlate, favored to be artifactual in nature. Single punctate chronic microhemorrhage noted within the left frontal centrum semi ovale, of doubtful significance in isolation. No mass lesion, midline shift or mass effect. No hydrocephalus. No extra-axial fluid collection. Pituitary gland suprasellar region within normal limits. Midline structures intact. Vascular: Abnormal flow void within the right ICA to the cavernous segment, consistent with known chronic right ICA occlusion. Otherwise, major intracranial vascular flow voids are maintained, with particular note made of preserved flow voids within the right MCA distribution. Skull and upper cervical spine: Craniocervical junction within normal limits. Degenerative spondylosis noted at C3-4 with resultant mild spinal stenosis. Bone marrow signal intensity within normal limits. No scalp soft tissue abnormality. Sinuses/Orbits: Patient status post bilateral ocular lens replacement. Scattered mucosal thickening noted throughout the paranasal sinuses, inflammatory/allergic in nature and likely chronic. No evidence for acute sinusitis. No mastoid effusion. Inner ear structures grossly normal. Other: None. IMPRESSION: 1. Patchy multifocal acute ischemic right MCA territory infarcts as above. No associated  hemorrhage or mass effect. 2. Abnormal flow void within the right ICA to the cavernous segment, consistent with known chronic right ICA occlusion. Major intracranial vascular flow voids are  otherwise maintained. 3. Underlying age-related cerebral atrophy with mild chronic small vessel ischemic disease. Electronically Signed   By: Jeannine Boga M.D.   On: 12/18/2019 04:30   CT C-SPINE NO CHARGE  Result Date: 12/17/2019 CLINICAL DATA:  Headache, neurologic deficit EXAM: CT CERVICAL SPINE WITHOUT CONTRAST TECHNIQUE: Multidetector CT imaging of the cervical spine was performed without intravenous contrast. Multiplanar CT image reconstructions were also generated. COMPARISON:  10/23/2016 x-ray, 09/04/2011 and 08/29/2019 chest CT FINDINGS: Alignment: No facet joint dislocation. Dens and lateral masses are aligned. No traumatic listhesis. Skull base and vertebrae: No acute fracture. No primary bone lesion or focal pathologic process. Soft tissues and spinal canal: No prevertebral fluid or swelling. No visible canal hematoma. Disc levels: Advanced multilevel disc height loss with degenerative endplate spurring. Severe multilevel left-sided facet arthropathy with suggestion of severe left-sided foraminal stenosis at C3-4, C4-5, and C5-6. Upper chest: Centrilobular and paraseptal emphysema within the lung apices. There are a few small 2-3 mm right upper lobe pulmonary nodules, stable from 09/04/2011. Mildly enlarged AP window lymph node, stable from 08/29/2019. Other: Aortic and carotid atherosclerotic calcification. IMPRESSION: 1. No acute fracture or traumatic listhesis of the cervical spine. 2. 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 3. Emphysema and aortic atherosclerosis. Aortic Atherosclerosis (ICD10-I70.0) and Emphysema (ICD10-J43.9). Electronically Signed   By: Davina Poke D.O.   On:  12/17/2019 15:18   CT Code Stroke Cerebral Perfusion with contrast  Result Date: 12/17/2019 CLINICAL DATA:  Left-sided facial droop.  Abnormal CT scan. EXAM: CT ANGIOGRAPHY HEAD AND NECK CT PERFUSION BRAIN TECHNIQUE: Multidetector CT imaging of the head and neck was performed using the standard protocol during bolus administration of intravenous contrast. Multiplanar CT image reconstructions and MIPs were obtained to evaluate the vascular anatomy. Carotid stenosis measurements (when applicable) are obtained utilizing NASCET criteria, using the distal internal carotid diameter as the denominator. Multiphase CT imaging of the brain was performed following IV bolus contrast injection. Subsequent parametric perfusion maps were calculated using RAPID software. CONTRAST:  130mL OMNIPAQUE IOHEXOL 350 MG/ML SOLN COMPARISON:  CT head without contrast 12/17/2019 FINDINGS: CTA NECK FINDINGS Aortic arch: A 3 vessel arch configuration is present. Atherosclerotic changes are present in the arch and at the origins of the innominate and subclavian arteries without significant stenosis or aneurysm. Right carotid system: Minimal wall calcifications are present within the right common carotid artery. Dense calcifications are present bifurcation. The right ICA is occluded without reconstitution in the neck. Left carotid system: Left carotid artery demonstrates scattered mural calcifications. Lumen is narrowed to 3.5 mm. No significant stenosis of greater than 50% is present. Moderate tortuosity is present within the cervical left ICA without other stenosis. Vertebral arteries: The vertebral arteries originate from the subclavian arteries bilaterally. The right vertebral artery is the dominant vessel. Mild proximal stenosis of less than 50% are present bilaterally. No additional stenoses are present in either vertebral artery in the neck. Skeleton: Multilevel degenerative changes are present. No focal lytic or blastic lesions are  present. Patient is edentulous. Other neck: Soft tissues the neck are otherwise unremarkable. Salivary glands are within normal limits. No significant adenopathy is present. Thyroid is normal. Upper chest: Centrilobular emphysematous changes are present. Focal nodule or mass lesion is present. Thoracic inlet is normal. Review of the MIP images confirms the above findings CTA HEAD FINDINGS Anterior circulation: Minimal contrast is present in the cavernous right internal carotid artery from  the ophthalmic segment to the posterior communicating artery. There is severe narrowing of the terminal right ICA. Atherosclerotic changes are present the cavernous left ICA without focal stenosis through the ICA terminus. The left A1 and M1 segments are normal. The anterior communicating artery is patent. Contrast is present in the right A1 segment. Right M1 segment is occluded. Minimal collaterals are present. Left MCA branch vessels and bilateral ACA branch vessels are within normal limits. Posterior circulation: The right vertebral artery is slightly dominant. PICA origins are visualized and normal. Vertebrobasilar junction is normal. Basilar artery is normal. Left posterior cerebral artery originates from basilar tip. The right posterior cerebral artery is fed by a small right posterior communicating artery and small right P1 segment. PCA branch vessels are intact bilaterally. Venous sinuses: The dural sinuses are patent. The straight sinus and deep cerebral veins are intact. Cortical veins are unremarkable. No vascular malformation is present. Anatomic variants: Fetal type right posterior cerebral artery remains patent with a small right P1 segment Review of the MIP images confirms the above findings CT Brain Perfusion Findings: ASPECTS: 6/10 CBF (<30%) Volume: 47mL Perfusion (Tmax>6.0s) volume: 154mL Mismatch Volume: 152mL Infarction Location:Right MCA territory IMPRESSION: 1. Right MCA territory infarct with core infarct of  38 mL. 2. Proximal right M1 large vessel occlusion with minimal collaterals. 3. 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 4. Short-segment reconstitution of the right ICA in the cavernous segment continues to severe stenosis of the terminal right internal carotid artery. 5. The right posterior cerebral artery is fed by a small right posterior communicating artery and small right P1 segment. 6. Aortic Atherosclerosis (ICD10-I70.0) and Emphysema (ICD10-J43.9). 7. Multilevel degenerative changes in the cervical spine. These results were called by telephone at the time of interpretation on 12/17/2019 at 2:35 pm to provider Rory Percy, who verbally acknowledged these results. Electronically Signed   By: San Morelle M.D.   On: 12/17/2019 15:10   DG CHEST PORT 1 VIEW  Result Date: 12/17/2019 CLINICAL DATA:  Intubation. EXAM: PORTABLE CHEST 1 VIEW COMPARISON:  Radiograph 12/05/2019, chest CT 08/29/2019 FINDINGS: Endotracheal tube tip is 5.9 cm from the carina. Tip and side port of the enteric tube below the diaphragm in the stomach, the tube may be kinked at the side port. Cardiomegaly with slight increase from prior exam. Chronic interstitial coarsening. Chronic right basilar pleural calcifications and blunting of the costophrenic angle. No new airspace disease. No pneumothorax. IMPRESSION: 1. Endotracheal tube tip 5.9 cm from the carina. 2. Enteric tube tip and side port below the diaphragm in the stomach, the tube may be kinked at the side port. 3. Slight increase in cardiomegaly from prior. Chronic interstitial coarsening. Electronically Signed   By: Keith Rake M.D.   On: 12/17/2019 20:58   CT HEAD CODE STROKE WO CONTRAST  Result Date: 12/17/2019 CLINICAL DATA:  Code stroke. Left facial droop. Right-sided gaze. Last seen normal at 9 a.m. EXAM: CT HEAD WITHOUT CONTRAST TECHNIQUE: Contiguous axial images were obtained from the base of the skull through the vertex without  intravenous contrast. COMPARISON:  CT head without contrast 12/05/2019 at [facility]. FINDINGS: Brain: Acute nonhemorrhagic infarct is present in the right lentiform nucleus, crossing the internal capsule and involving the right caudate head. Infarct involves the posterior left scratched at the infarct involves posterior right lentiform nucleus and portions of the right temporal lobe. Anterior insular cortex is intact. No additional cortical abnormalities are present over the convexity. Left basal ganglia is within normal  limits. Moderate white matter changes are present bilaterally. No acute hemorrhage or mass lesion is present. The ventricles are of normal size. The brainstem and cerebellum are within normal limits. No significant extraaxial fluid collection is present. Vascular: Atherosclerotic calcifications are present within the cavernous internal carotid arteries bilaterally. No hyperdense vessel is present. Skull: Calvarium is intact. No focal lytic or blastic lesions are present. No significant extracranial soft tissue lesion is present. Sinuses/Orbits: Benign chondroid lesion or ostium of the right frontal sinus is stable. The paranasal sinuses and mastoid air cells are otherwise clear. Bilateral lens replacements are noted. Globes and orbits are otherwise unremarkable. ASPECTS [facility] Stroke Program Early CT Score) - Ganglionic level infarction (caudate, lentiform nuclei, internal capsule, insula, M1-M3 cortex): 3/7 - Supraganglionic infarction (M4-M6 cortex): 3/3 Total score (0-10 with 10 being normal): 6/10 IMPRESSION: 1. Acute nonhemorrhagic infarct involving the right lentiform nucleus, crossing the internal capsule and portions of the right caudate head. 2. Acute nonhemorrhagic infarct involving the posterior right insular ribbon. Infarct may involve portions of the temporal lobe. 3. ASPECTS is 6/10. 4. Stable moderate white matter disease. The above was relayed via text pager to Dr. Rory Percy on  12/17/2019 at 14:06 . Electronically Signed   By: San Morelle M.D.   On: 12/17/2019 14:08   ECHOCARDIOGRAM LIMITED  Result Date: 12/18/2019    ECHOCARDIOGRAM LIMITED REPORT   Patient Name:   TROYE HIEMSTRA Date of Exam: 12/18/2019 Medical Rec #:  [id number]    Height:       72.0 in Accession #:    [id number]   Weight:       193.6 lb Date of Birth:  1940-10-20    BSA:          2.101 m Patient Age:    25 years     BP:           122/62 mmHg Patient Gender: M            HR:           53 bpm. Exam Location:  Inpatient Procedure: Limited Color Doppler, Cardiac Doppler and Limited Echo Indications:    stroke 434.91  History:        Patient has prior history of Echocardiogram examinations, most                 recent 09/30/2019. CAD, COPD and Parkinsons; Risk                 Factors:Hypertension, Dyslipidemia and Sleep Apnea.  Sonographer:    Johny Chess Referring Phys: [id number] ASHISH ARORA IMPRESSIONS  1. Left ventricular ejection fraction, by estimation, is 60 to 65%. The left ventricle has normal function. The left ventricle has no regional wall motion abnormalities. There is mild concentric left ventricular hypertrophy. Left ventricular diastolic parameters are consistent with Grade I diastolic dysfunction (impaired relaxation).  2. Right ventricular systolic function is normal. The right ventricular size is normal.  3. The mitral valve is normal in structure. Mild mitral valve regurgitation. No evidence of mitral stenosis.  4. The aortic valve is normal in structure. Aortic valve regurgitation is not visualized. No aortic stenosis is present.  5. The inferior vena cava is dilated in size with <50% respiratory variability, suggesting right atrial pressure of 15 mmHg. Conclusion(s)/Recommendation(s): No intracardiac source of embolism detected on this transthoracic study. A transesophageal echocardiogram is recommended to exclude cardiac source of embolism if clinically indicated. FINDINGS  Left Ventricle:  Left ventricular ejection fraction, by  estimation, is 60 to 65%. The left ventricle has normal function. The left ventricle has no regional wall motion abnormalities. The left ventricular internal cavity size was normal in size. There is  mild concentric left ventricular hypertrophy. Normal left ventricular filling pressure. Right Ventricle: The right ventricular size is normal. No increase in right ventricular wall thickness. Right ventricular systolic function is normal. Left Atrium: Left atrial size was normal in size. Right Atrium: Right atrial size was normal in size. Pericardium: There is no evidence of pericardial effusion. Mitral Valve: The mitral valve is normal in structure. There is moderate thickening of the mitral valve leaflet(s). Normal mobility of the mitral valve leaflets. Moderate mitral annular calcification. Mild mitral valve regurgitation, with centrally-directed jet. No evidence of mitral valve stenosis. Tricuspid Valve: The tricuspid valve is normal in structure. Tricuspid valve regurgitation is trivial. No evidence of tricuspid stenosis. Aortic Valve: The aortic valve is normal in structure. Aortic valve regurgitation is not visualized. No aortic stenosis is present. Pulmonic Valve: The pulmonic valve was normal in structure. Pulmonic valve regurgitation is not visualized. No evidence of pulmonic stenosis. Aorta: The aortic root is normal in size and structure. Venous: The inferior vena cava is dilated in size with less than 50% respiratory variability, suggesting right atrial pressure of 15 mmHg. IAS/Shunts: No atrial level shunt detected by color flow Doppler.  LEFT VENTRICLE PLAX 2D LVIDd:         4.40 cm  Diastology LVIDs:         2.70 cm  LV e' lateral:   9.14 cm/s LV PW:         1.10 cm  LV E/e' lateral: 10.9 LV IVS:        0.90 cm  LV e' medial:    8.05 cm/s LVOT diam:     1.90 cm  LV E/e' medial:  12.3 LV SV:         93 LV SV Index:   44 LVOT Area:     2.84 cm  IVC IVC diam: 2.50 cm  LEFT ATRIUM         Index LA diam:    3.10 cm 1.48 cm/m  AORTIC VALVE LVOT Vmax:   123.00 cm/s LVOT Vmean:  81.200 cm/s LVOT VTI:    0.328 m  AORTA Ao Root diam: 3.40 cm MITRAL VALVE MV Area (PHT): 3.27 cm    SHUNTS MV Decel Time: 232 msec    Systemic VTI:  0.33 m MV E velocity: 99.40 cm/s  Systemic Diam: 1.90 cm MV A velocity: 90.40 cm/s MV E/A ratio:  1.10 Ena Dawley MD Electronically signed by Ena Dawley MD Signature Date/Time: 12/18/2019/4:32:25 PM    Final     Labs:  CBC: Recent Labs    12/17/19 1354 12/17/19 2054 12/18/19 0829 12/19/19 0522  WBC 8.2  --  7.1 6.1  HGB 12.3* 14.3 12.4* 11.8*  HCT 37.9* 42.0 37.3* 36.9*  PLT 128*  --  129* 121*    COAGS: Recent Labs    12/17/19 1354  INR 1.1  APTT 31    BMP: Recent Labs    12/17/19 1352 12/17/19 1352 12/17/19 1354 12/17/19 2054 12/18/19 0829 12/19/19 0522  NA 139   < > 138 139 141 139  K 3.9   < > 4.1 4.1 3.6 3.8  CL 104  --  106  --  108 107  CO2  --   --  24  --  25 25  GLUCOSE 90  --  95  --  113* 84  BUN 21  --  20  --  13 10  CALCIUM  --   --  8.4*  --  8.7* 8.2*  CREATININE 1.10  --  1.21  --  1.10 1.07  GFRNONAA  --   --  57*  --  >60 >60  GFRAA  --   --  >60  --  >60 >60   < > = values in this interval not displayed.    LIVER FUNCTION TESTS: Recent Labs    12/17/19 1354  BILITOT 1.0  AST 21  ALT 15  ALKPHOS 38  PROT 5.6*  ALBUMIN 3.4*    Assessment and Plan:  History of acute CVA s/p cerebral arteriogram with emergent mechanical thrombectomy of right MCA achieving a TICI 2b revascularization via right femoral approach 12/17/2019 by Dr. Karenann Cai. Patient's condition stable, he is neurologically intact at this time. Right groin incision stable, distal pulses (DPs) 2+ bilaterally. Further plans per neurology- appreciate and agree with management. Please call NIR with questions/concerns.   Electronically Signed: Earley Abide, PA-C 12/19/2019, 10:51 AM   I spent a  total of 25 Minutes at the the patient's bedside AND on the patient's hospital floor or unit, greater than 50% of which was counseling/coordinating care for CVA s/p revascularization.

## 2019-12-19 NOTE — Progress Notes (Signed)
Physical Therapy Treatment Patient Details Name: Allen Barnes MRN: [id number] DOB: 01-03-1941 Today's Date: 12/19/2019    History of Present Illness 79 y.o. male  With PMH parkinson's disease, HTN, COPD ( baseline 2L O2), DM2, CAD who presented to [facility] ED as a code stroke for c/o left facial droop right gaze. Pt fell and hit the back of his head, reporting some dizziness but no LOC. Later became dizzy again with facial droop and called EMS. [place] demonstrating acute nonhemorrhagic infarct involving the right lentiform    PT Comments    Patient progressing with hallway ambulation with RW and min A with increased time to initiate movement with multimodal cues needed as well as increased time with maneuvering around obstacles after running into them.  Patient with L hemianopia and difficulty compensating for it.  Feel he his close to 100% fall risk unaided and will benefit from CIR level rehab prior to d/c home to maximize safety and independence.  PT to follow acutely.    Follow Up Recommendations  CIR     Equipment Recommendations  Rolling walker with 5" wheels    Recommendations for Other Services       Precautions / Restrictions Precautions Precautions: Fall Precaution Comments: L hemianopia    Mobility  Bed Mobility Overal bed mobility: Needs Assistance Bed Mobility: Supine to Sit     Supine to sit: Min assist     General bed mobility comments: up in chair  Transfers Overall transfer level: Needs assistance Equipment used: Rolling walker (2 wheeled) Transfers: Sit to/from Stand Sit to Stand: Min guard Stand pivot transfers: Mod assist       General transfer comment: rises pretty much unaided, needs assist for lines/safety  Ambulation/Gait Ambulation/Gait assistance: Min guard;Min assist Gait Distance (Feet): 120 Feet Assistive device: Rolling walker (2 wheeled) Gait Pattern/deviations: Step-through pattern;Decreased stride length;Wide base of support     General  Gait Details: mild tremor noted throughout, slow to initiate needing several multimodal cues to start, did check BP in standing prior to ambulation 115/60 without c/o dizziness; assist for negotiating any obstacle in front and to L side with cues and pt taking time after running into obstacles to move R to get around it.   Stairs             Wheelchair Mobility    Modified Rankin (Stroke Patients Only) Modified Rankin (Stroke Patients Only) Pre-Morbid Rankin Score: No symptoms Modified Rankin: Moderately severe disability     Balance Overall balance assessment: Needs assistance Sitting-balance support: Single extremity supported;No upper extremity supported Sitting balance-Leahy Scale: Fair Sitting balance - Comments: minA to maintain static sitting balance at the edge of bed   Standing balance support: Bilateral upper extremity supported Standing balance-Leahy Scale: Poor Standing balance comment: UE support for balance                            Cognition Arousal/Alertness: Awake/alert Behavior During Therapy: WFL for tasks assessed/performed Overall Cognitive Status: Difficult to assess Area of Impairment: Attention;Memory;Following commands;Safety/judgement;Awareness;Problem solving                   Current Attention Level: Selective Memory: Decreased short-term memory Following Commands: Follows one step commands with increased time;Follows one step commands consistently Safety/Judgement: Decreased awareness of safety;Decreased awareness of deficits Awareness: Emergent Problem Solving: Slow processing;Requires verbal cues;Difficulty sequencing General Comments: cognition difficult to fully assess due to severity of hearing loss.  He demonstrates  difficulty following 2 step commands, and demonstrates decreased awareness of Lt visual field deficit       Exercises Other Exercises Other Exercises: sit<>stand x 5 minguard A    General Comments  General comments (skin integrity, edema, etc.): 90% SpO2 with ambulation on RA, reports wearing O2 at night; returned to 100% at rest.      Pertinent Vitals/Pain Pain Assessment: Faces Faces Pain Scale: Hurts little more Pain Location: Rt UE - reports it has been painful since recent fall  Pain Descriptors / Indicators: Grimacing;Guarding;Aching Pain Intervention(s): Monitored during session;Repositioned    Home Living Family/patient expects to be discharged to:: Private residence Living Arrangements: Alone Available Help at Discharge: Family;Available 24 hours/day Type of Home: House Home Access: Stairs to enter Entrance Stairs-Rails: Left Home Layout: One level Home Equipment: Cane - single point;Grab bars - tub/shower Additional Comments: Pt reports he was staying with his brother after his bladder surgery     Prior Function Level of Independence: Independent with assistive device(s)      Comments: pt ambulates with cane.  he reports occasional falls.  he does drive, grocery shops, and enjoys wood working - builds bird houses and bird feeders    PT Goals (current goals can now be found in the care plan section) Acute Rehab PT Goals Patient Stated Goal: To improve mobility and aide in a return to independence Progress towards PT goals: Progressing toward goals    Frequency    Min 4X/week      PT Plan Current plan remains appropriate    Co-evaluation              AM-PAC PT "6 Clicks" Mobility   Outcome Measure  Help needed turning from your back to your side while in a flat bed without using bedrails?: A Little Help needed moving from lying on your back to sitting on the side of a flat bed without using bedrails?: A Little Help needed moving to and from a bed to a chair (including a wheelchair)?: A Little Help needed standing up from a chair using your arms (e.g., wheelchair or bedside chair)?: A Little Help needed to walk in hospital room?: A Little Help  needed climbing 3-5 steps with a railing? : A Lot 6 Click Score: 17    End of Session Equipment Utilized During Treatment: Gait belt Activity Tolerance: Patient tolerated treatment well Patient left: in chair;with call bell/phone within reach;with chair alarm set   PT Visit Diagnosis: Unsteadiness on feet (R26.81);Muscle weakness (generalized) (M62.81);Other symptoms and signs involving the nervous system (R29.898)     Time: 2446-2863 PT Time Calculation (min) (ACUTE ONLY): 24 min  Charges:  $Gait Training: 8-22 mins $Therapeutic Activity: 8-22 mins                     Magda Kiel, PT Acute Rehabilitation Services [phone] Office:[phone] 12/19/2019    Reginia Naas 12/19/2019, 1:12 PM

## 2019-12-19 NOTE — Progress Notes (Signed)
STROKE TEAM PROGRESS NOTE   INTERVAL HISTORY Patient is sitting up in bed.  He has no complaints.  Vital signs are stable.  OBJECTIVE Vitals:   12/19/19 0700 12/19/19 0800 12/19/19 0819 12/19/19 0822  BP: (!) 158/86 135/71    Pulse: (!) 47 83 80   Resp: 12 20 19    Temp:  99.2 F (37.3 C)    TempSrc:  Oral    SpO2: 100% 95% 100% 100%  Weight:      Height:        CBC:  Recent Labs  Lab 12/17/19 1354 12/17/19 2054 12/18/19 0829 12/19/19 0522  WBC 8.2  --  7.1 6.1  NEUTROABS 6.4  --   --   --   HGB 12.3*   < > 12.4* 11.8*  HCT 37.9*   < > 37.3* 36.9*  MCV 104.1*  --  103.3* 105.4*  PLT 128*  --  129* 121*   < > = values in this interval not displayed.    Basic Metabolic Panel:  Recent Labs  Lab 12/18/19 0829 12/19/19 0522  NA 141 139  K 3.6 3.8  CL 108 107  CO2 25 25  GLUCOSE 113* 84  BUN 13 10  CREATININE 1.10 1.07  CALCIUM 8.7* 8.2*  MG 1.7 1.6*  PHOS 3.2 4.3    Lipid Panel:     Component Value Date/Time   CHOL 148 12/18/2019 0456   TRIG 111 12/18/2019 0456   TRIG 114 12/18/2019 0456   HDL 56 12/18/2019 0456   CHOLHDL 2.6 12/18/2019 0456   VLDL 22 12/18/2019 0456   LDLCALC 70 12/18/2019 0456   HgbA1c:  Lab Results  Component Value Date   HGBA1C 5.8 (H) 12/18/2019   Urine Drug Screen: No results found for: LABOPIA, COCAINSCRNUR, LABBENZ, AMPHETMU, THCU, LABBARB  Alcohol Level No results found for: [facility]  IMAGING past 24h ECHOCARDIOGRAM LIMITED  Result Date: 12/18/2019    ECHOCARDIOGRAM LIMITED REPORT   Patient Name:   NOLLIE TERLIZZI Date of Exam: 12/18/2019 Medical Rec #:  [id number]    Height:       72.0 in Accession #:    [id number]   Weight:       193.6 lb Date of Birth:  [birth year]    BSA:          2.101 m Patient Age:    79 years     BP:           122/62 mmHg Patient Gender: M            HR:           53 bpm. Exam Location:  Inpatient Procedure: Limited Color Doppler, Cardiac Doppler and Limited Echo Indications:    stroke 434.91  History:         Patient has prior history of Echocardiogram examinations, most                 recent 09/30/2019. CAD, COPD and Parkinsons; Risk                 Factors:Hypertension, Dyslipidemia and Sleep Apnea.  Sonographer:    Johny Chess Referring Phys: [id number] ASHISH ARORA IMPRESSIONS  1. Left ventricular ejection fraction, by estimation, is 60 to 65%. The left ventricle has normal function. The left ventricle has no regional wall motion abnormalities. There is mild concentric left ventricular hypertrophy. Left ventricular diastolic parameters are consistent with Grade I diastolic dysfunction (impaired relaxation).  2. Right ventricular systolic function  is normal. The right ventricular size is normal.  3. The mitral valve is normal in structure. Mild mitral valve regurgitation. No evidence of mitral stenosis.  4. The aortic valve is normal in structure. Aortic valve regurgitation is not visualized. No aortic stenosis is present.  5. The inferior vena cava is dilated in size with <50% respiratory variability, suggesting right atrial pressure of 15 mmHg. Conclusion(s)/Recommendation(s): No intracardiac source of embolism detected on this transthoracic study. A transesophageal echocardiogram is recommended to exclude cardiac source of embolism if clinically indicated. FINDINGS  Left Ventricle: Left ventricular ejection fraction, by estimation, is 60 to 65%. The left ventricle has normal function. The left ventricle has no regional wall motion abnormalities. The left ventricular internal cavity size was normal in size. There is  mild concentric left ventricular hypertrophy. Normal left ventricular filling pressure. Right Ventricle: The right ventricular size is normal. No increase in right ventricular wall thickness. Right ventricular systolic function is normal. Left Atrium: Left atrial size was normal in size. Right Atrium: Right atrial size was normal in size. Pericardium: There is no evidence of pericardial effusion.  Mitral Valve: The mitral valve is normal in structure. There is moderate thickening of the mitral valve leaflet(s). Normal mobility of the mitral valve leaflets. Moderate mitral annular calcification. Mild mitral valve regurgitation, with centrally-directed jet. No evidence of mitral valve stenosis. Tricuspid Valve: The tricuspid valve is normal in structure. Tricuspid valve regurgitation is trivial. No evidence of tricuspid stenosis. Aortic Valve: The aortic valve is normal in structure. Aortic valve regurgitation is not visualized. No aortic stenosis is present. Pulmonic Valve: The pulmonic valve was normal in structure. Pulmonic valve regurgitation is not visualized. No evidence of pulmonic stenosis. Aorta: The aortic root is normal in size and structure. Venous: The inferior vena cava is dilated in size with less than 50% respiratory variability, suggesting right atrial pressure of 15 mmHg. IAS/Shunts: No atrial level shunt detected by color flow Doppler.  LEFT VENTRICLE PLAX 2D LVIDd:         4.40 cm  Diastology LVIDs:         2.70 cm  LV e' lateral:   9.14 cm/s LV PW:         1.10 cm  LV E/e' lateral: 10.9 LV IVS:        0.90 cm  LV e' medial:    8.05 cm/s LVOT diam:     1.90 cm  LV E/e' medial:  12.3 LV SV:         93 LV SV Index:   44 LVOT Area:     2.84 cm  IVC IVC diam: 2.50 cm LEFT ATRIUM         Index LA diam:    3.10 cm 1.48 cm/m  AORTIC VALVE LVOT Vmax:   123.00 cm/s LVOT Vmean:  81.200 cm/s LVOT VTI:    0.328 m  AORTA Ao Root diam: 3.40 cm MITRAL VALVE MV Area (PHT): 3.27 cm    SHUNTS MV Decel Time: 232 msec    Systemic VTI:  0.33 m MV E velocity: 99.40 cm/s  Systemic Diam: 1.90 cm MV A velocity: 90.40 cm/s MV E/A ratio:  1.10 Ena Dawley MD Electronically signed by Ena Dawley MD Signature Date/Time: 12/18/2019/4:32:25 PM    Final      PHYSICAL EXAM   GENERAL: Pleasant elderly Caucasian male not in distress.  HEENT: Neck is supple and no trauma noted.    Hearing is impaired  significantly.  ABDOMEN: soft  EXTREMITIES: No edema   BACK: Normal  SKIN: Normal by inspection.    MENTAL STATUS: He is awake and alert.  He does converses decently.  He does follow commands briskly.  There is mild dysarthria noted.  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  MOTOR: Right upper and lower extremities show 4/5 strength.  Left upper extremity 3/5 and the left lower extremity 4.  Diminished fine finger movements on the left.  Orbits right over left upper extremity.  Left grip weakness.  Mild action tremor of left upper extremity greater than right increase with position holding  COORDINATION: No tremors, dysmetria or myoclonus noted.  SENSATION: Normal to light touch and pain.   ASSESSMENT/PLAN Allen Barnes is a 79 y.o. male with history of parkinson's disease, HTN, COPD ( baseline 2L O2), DM2, CAD, 2nd COVID vaccine dose 08/2019, and on 12/15/19 he had a transurethral procedure at [facility] for hematuria-questionable mass in the bladder.  He presented to [facility] ED on 12/17/19 as a code stroke for c/o left facial droop right gaze. Patient was LKW at 0800 when he fell and hit the back of his head. He was a little dizzy afterwards but no LOC. He did not receive IV t-PA due to late presentation.  Neuro Interventional Radiology - Cerebral Angiogram with Intervention - Dr. Erven Colla de Sindy Messing  12/17/19 7:45 pm Diagnosis: Right ICA chronic occlusion; right M1 acute occlusion Mechanical thrombectomy right MCA (likely TICI2B). Embolus to the distal left A3 noted. Mechanical thrombectomy performed with recanalization up to the A4 segment.   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  Code Stroke CT Head - Acute nonhemorrhagic infarct involving the right lentiform nucleus, crossing the internal capsule and portions of the right caudate head. Acute nonhemorrhagic infarct involving the posterior right insular ribbon. Infarct may involve portions of the temporal lobe. ASPECTS is 6/10. Stable moderate white matter disease.   MRI head - Patchy multifocal acute ischemic right MCA territory infarcts as above. No associated hemorrhage or mass effect. Abnormal flow void within the right ICA to the cavernous segment, consistent with known chronic right ICA occlusion. Major intracranial vascular flow voids are otherwise maintained. Underlying age-related cerebral atrophy with mild chronic small vessel ischemic disease.   MRA head - not ordered  CTA H&N - Proximal right M1 large vessel occlusion with minimal collaterals. 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 Short-segment reconstitution of the right ICA in the cavernous segment continues to severe stenosis of the terminal right internal carotid artery. The right posterior cerebral artery is fed by a small right posterior communicating artery and small right P1 segment  CT Perfusion -  Right MCA territory infarct with core infarct of 38 mL  2D Echo - EF 60-65%. No source of embolus   [employer] Virus 2 - negative  LDL - 70  HgbA1c - 5.8  UDS - not ordered  VTE prophylaxis - SCDs    aspirin 81 mg daily prior to admission, now on none  Therapy recommendations:  CIR  Disposition:  Pending (lives alone, cousin next door)  Hypertension  Home BP meds: Cozaar  ;  Imdur  Current BP meds: Add losartan 100 mg  Stable . SBP goal 140-160 per IR . Long-term BP goal normotensive  Diabetes, type II  controlled  HgbA1c 5.8, at goal < 7.0  Home meds: Aspirin, Xanax, Breo Ellipta, vitamin H96, Colace, garlic, Imdur, Cozaar, oxygen 2 L, Pravachol 20 mg, Symbicort, Flomax  Hyperlipidemia  Home Lipid lowering medication: Pravachol 20 mg daily  LDL 70, goal < 70  Current lipid lowering medication: Pravachol 20 mg  Continue statin at discharge  Other Stroke Risk Factors  Advanced age  Former cigarette smoker - quit    ETOH use, advised to drink no more than 1 alcoholic beverage per day.  Coronary artery disease  OSA  No hx stroke  Other Active Problems  Aortic Atherosclerosis (ICD10-I70.0)   Emphysema (QIW97-L89.9)  Mild thrombocytopenia  Recent prostate / bladder procedure for hematuria  Recent fall  Parkinson's Disease  Hospital day # 2  Continue aspirin and aggressive risk factor modification.  Mobilize out of bed.  Continue ongoing therapies.  Transfer to neurology floor bed today.  Anticipate transfer to inpatient rehab in the next few days when bed becomes available after insurance approval.  Greater than 50% time during this 35-minute visit was spent on counseling and coordination of care about his embolic stroke and discussion with care team and answering questions Antony Contras, MD Medical Director [place] Pager: [phone] 12/19/2019 3:25 PM To contact Stroke Continuity provider, please refer to [email or web address] After hours, contact General Neurology

## 2019-12-19 NOTE — Evaluation (Signed)
Occupational Therapy Evaluation Patient Details Name: Allen Barnes MRN: [id number] DOB: [birth year] Today's Date: 12/19/2019    History of Present Illness 79 y.o. male  With PMH parkinson's disease, HTN, COPD ( baseline 2L O2), DM2, CAD who presented to [facility] ED as a code stroke for c/o left facial droop right gaze. Pt fell and hit the back of his head, reporting some dizziness but no LOC. Later became dizzy again with facial droop and called EMS. [place] demonstrating acute nonhemorrhagic infarct involving the right lentiform   Clinical Impression   Pt admitted with above. He demonstrates the below listed deficits and will benefit from continued OT to maximize safety and independence with BADLs.  Pt presents to OT with impaired balance, Lt homonymous hemianopia, Lt inattention, impaired coordination, impaired cognition, and significant hearing loss.  He currently requires set up assist - mod A for ADLs and min - mod A for functional mobility.  He reports he lives alone, and was fully independent with ADLs including driving, grocery shopping, and wood working.  He reports he has good family support at discharge.  Recommend CIR level rehab to allow him to maximize safety and independence with ADLs.       Follow Up Recommendations  CIR;Supervision/Assistance - 24 hour    Equipment Recommendations  3 in 1 bedside commode    Recommendations for Other Services Rehab consult     Precautions / Restrictions Precautions Precautions: Fall      Mobility Bed Mobility Overal bed mobility: Needs Assistance Bed Mobility: Supine to Sit     Supine to sit: Min assist     General bed mobility comments: assist to lift trunk   Transfers Overall transfer level: Needs assistance Equipment used: Rolling walker (2 wheeled);1 person hand held assist Transfers: Sit to/from [facility] Sit to Stand: Min assist Stand pivot transfers: Mod assist       General transfer comment: Pt requires  mod cues for hand placement.  He demonstrates posterior lean.  He requires assist for balance, and walker manipulation during transfer     Balance Overall balance assessment: Needs assistance Sitting-balance support: Single extremity supported;No upper extremity supported Sitting balance-Leahy Scale: Fair Sitting balance - Comments: minA to maintain static sitting balance at the edge of bed   Standing balance support: Bilateral upper extremity supported Standing balance-Leahy Scale: Poor Standing balance comment: min A for static standing                            ADL either performed or assessed with clinical judgement   ADL Overall ADL's : Needs assistance/impaired Eating/Feeding: Set up;Sitting   Grooming: Wash/dry hands;Wash/dry face;Brushing hair;Minimal assistance;Standing Grooming Details (indicate cue type and reason): min cues to locate items on the Lt.  Pt is endentulous  Upper Body Bathing: Supervision/ safety;Set up;Sitting   Lower Body Bathing: Moderate assistance;Sit to/from stand   Upper Body Dressing : Minimal assistance;Sitting   Lower Body Dressing: Moderate assistance;Sit to/from stand Lower Body Dressing Details (indicate cue type and reason): assist for balance  Toilet Transfer: Moderate assistance;Ambulation;Comfort height toilet;Grab bars;RW Armed forces technical officer Details (indicate cue type and reason): Pt overshoots seat due to Lt visual field deficit, and requires mod A for walker negotiation due to Lt field deficit - runs into doorframe and unable to self correct  Toileting- Clothing Manipulation and Hygiene: Moderate assistance;Sit to/from stand       Functional mobility during ADLs: Minimal assistance;Moderate assistance;Rolling walker  Vision Baseline Vision/History: No visual deficits Patient Visual Report: No change from baseline Vision Assessment?: Yes Eye Alignment: Within Functional Limits Ocular Range of Motion: Within Functional  Limits Visual Fields: Left homonymous hemianopsia Additional Comments: Pt with dense Lt homonymous hemianopsia.  He does seem to have minimal sparing in superior Lt quadrant      Perception Perception Perception Tested?: Yes Perception Deficits: Inattention/neglect Inattention/Neglect: Does not attend to left visual field   Praxis      Pertinent Vitals/Pain Pain Assessment: Faces Faces Pain Scale: Hurts little more Pain Location: Rt UE - reports it has been painful since recent fall  Pain Descriptors / Indicators: Grimacing;Guarding;Aching Pain Intervention(s): Monitored during session;Limited activity within patient's tolerance;Repositioned     Hand Dominance Right   Extremity/Trunk Assessment Upper Extremity Assessment Upper Extremity Assessment: RUE deficits/detail;LUE deficits/detail;Generalized weakness RUE Deficits / Details: shoulder limited due to pain when attempting to elevate it actively.  Intention tremor noted  RUE Coordination: decreased fine motor;decreased gross motor LUE Deficits / Details: Intention tremor noted. Pt demonstrates decreased isolated movement, but unsure if this is due to CVA or pt not fully understanding instruction due to severity of HOH  LUE Coordination: decreased fine motor;decreased gross motor   Lower Extremity Assessment Lower Extremity Assessment: Defer to PT evaluation       Communication Communication Communication: HOH   Cognition Arousal/Alertness: Awake/alert Behavior During Therapy: WFL for tasks assessed/performed Overall Cognitive Status: Difficult to assess Area of Impairment: Attention;Memory;Following commands;Safety/judgement;Awareness;Problem solving                   Current Attention Level: Selective Memory: Decreased short-term memory Following Commands: Follows one step commands with increased time;Follows one step commands consistently Safety/Judgement: Decreased awareness of safety;Decreased awareness of  deficits Awareness: Emergent Problem Solving: Slow processing;Requires verbal cues;Difficulty sequencing General Comments: cognition difficult to fully assess due to severity of hearing loss.  He demonstrates difficulty following 2 step commands, and demonstrates decreased awareness of Lt visual field deficit    General Comments  VSS with 02 sats >92% on RA (pt reports wearing 2L at night only at home).      Exercises     Shoulder Instructions      Home Living Family/patient expects to be discharged to:: Private residence Living Arrangements: Alone Available Help at Discharge: Family;Available 24 hours/day Type of Home: House Home Access: Stairs to enter [employer] of Steps: 2 Entrance Stairs-Rails: Left Home Layout: One level     Bathroom Shower/Tub: Teacher, early years/pre: Standard     Home Equipment: Cane - single point;Grab bars - tub/shower   Additional Comments: Pt reports he was staying with his brother after his bladder surgery       Prior Functioning/Environment Level of Independence: Independent with assistive device(s)        Comments: pt ambulates with cane.  he reports occasional falls.  he does drive, grocery shops, and enjoys wood working - builds bird houses and bird feeders         OT Problem List: Decreased activity tolerance;Decreased range of motion;Impaired balance (sitting and/or standing);Impaired vision/perception;Decreased coordination;Decreased cognition;Decreased safety awareness;Decreased knowledge of use of DME or AE;Impaired UE functional use;Pain      OT Treatment/Interventions: Self-care/ADL training;Neuromuscular education;DME and/or AE instruction;Therapeutic activities;Cognitive remediation/compensation;Visual/perceptual remediation/compensation;Patient/family education;Balance training    OT Goals(Current goals can be found in the care plan section) Acute Rehab OT Goals Patient Stated Goal: To improve mobility  and aide in a return to independence OT  Goal Formulation: With patient Time For Goal Achievement: 01/02/20 Potential to Achieve Goals: Good ADL Goals Pt Will Perform Grooming: with min guard assist;standing Pt Will Perform Lower Body Bathing: with min guard assist;sit to/from stand Pt Will Perform Upper Body Dressing: with supervision;with set-up;sitting Pt Will Perform Lower Body Dressing: with min guard assist;sit to/from stand Pt Will Transfer to Toilet: with min guard assist;ambulating;regular height toilet;bedside commode;grab bars Pt Will Perform Toileting - Clothing Manipulation and hygiene: with min guard assist;sit to/from stand Additional ADL Goal #1: Pt will locate needed ADLs items on the Lt with no more than min cues  OT Frequency: Min 2X/week   Barriers to D/C:            Co-evaluation              AM-PAC OT "6 Clicks" Daily Activity     Outcome Measure Help from another person eating meals?: A Little Help from another person taking care of personal grooming?: A Little Help from another person toileting, which includes using toliet, bedpan, or urinal?: A Lot Help from another person bathing (including washing, rinsing, drying)?: A Lot Help from another person to put on and taking off regular upper body clothing?: A Little Help from another person to put on and taking off regular lower body clothing?: A Lot 6 Click Score: 15   End of Session Equipment Utilized During Treatment: Gait belt;Rolling walker Nurse Communication: Mobility status  Activity Tolerance: Patient tolerated treatment well Patient left: in chair;with call bell/phone within reach;with chair alarm set;with family/visitor present  OT Visit Diagnosis: Unsteadiness on feet (R26.81);Cognitive communication deficit (R41.841);Low vision, both eyes (H54.2) Symptoms and signs involving cognitive functions: Cerebral infarction                Time: 1007-1219 OT Time Calculation (min): 42 min Charges:   OT General Charges $OT Visit: 1 Visit OT Evaluation $OT Eval Moderate Complexity: 1 Mod OT Treatments $Self Care/Home Management : 8-22 mins $Therapeutic Activity: 8-22 mins  Nilsa Nutting., OTR/L Acute Rehabilitation Services Pager [phone] Office [phone]   Lucille Passy M 12/19/2019, 11:38 AM

## 2019-12-19 NOTE — Evaluation (Signed)
Speech Language Pathology Evaluation Patient Details Name: Tibor Lemmons MRN: [id number] DOB: 04-05-1941 Today's Date: 12/19/2019 Time: 1545-1600 SLP Time Calculation (min) (ACUTE ONLY): 15 min  Problem List:  Patient Active Problem List   Diagnosis Date Noted  . Acute ischemic stroke ([place]) 12/17/2019  . Encounter for intubation   . COPD exacerbation ([place])   . Microscopic hematuria 06/02/2019  . Atherosclerosis of abdominal aorta ([place]) 02/21/2019  . 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 Pain of right heel 12/02/2018  . Acquired plantar porokeratosis 11/18/2018  . Atherosclerosis of both carotid arteries 05/18/2018  . Calculus of gallbladder without cholecystitis without obstruction 05/18/2018  . Nicotine dependence, uncomplicated 32/95/1884  . Plantar fasciitis of right foot 03/11/2016  . Callus of foot 12/11/2015  . Metatarsalgia of right foot 12/11/2015  . Colon polyps 09/03/2015  . Primary osteoarthritis involving multiple joints 09/03/2015  . Vitamin B12 deficiency 09/03/2015  . Anxiety disorder 08/31/2015  . Asthma 08/31/2015  . BPH without urinary obstruction 08/31/2015  . COPD (chronic obstructive pulmonary disease) with chronic bronchitis ([place]) 08/31/2015  . Coronary artery stenosis 08/31/2015  . Diabetes mellitus type 2, controlled ([place]) 08/31/2015  . Essential hypertension 08/31/2015  . High risk medication use 08/31/2015  . Insomnia 08/31/2015  . Mixed hyperlipidemia 08/31/2015  . Parkinson's disease ([place]) 08/31/2015  . Sleep apnea 08/31/2015  . Coronary artery disease involving native coronary artery of native heart without angina pectoris 04/10/2015  . Smoking 04/10/2015   Past Medical History:  Past Medical History:  Diagnosis Date  . Acquired plantar porokeratosis 11/18/2018  . Anxiety disorder 08/31/2015  . Asthma 08/31/2015  . Atherosclerosis of abdominal aorta ([place]) 02/21/2019   Seen on CT by urologist. 12/2018.  Formatting of this note might be  different from the original. Seen on CT by urologist. 12/2018.  Marland Kitchen Atherosclerosis of both carotid arteries 05/18/2018   Right occluded. Left <50% on 04/2018 [place].  Formatting of this note might be different from the original. Right occluded. Left <50% on 04/2018 [place].  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 Calculus of gallbladder without cholecystitis without obstruction 05/18/2018   Asymptomatic. Seen on CT 04/2018  Formatting of this note might be different from the original. Asymptomatic. Seen on CT 04/2018  . Callus of foot 12/11/2015  . Colon polyps 09/03/2015   Overview:  [place]. 2017. Polyps.  Q 3 yrs.  Marland Kitchen COPD (chronic obstructive pulmonary disease) with chronic bronchitis ([place]) 08/31/2015   Overview:  Wears O2 at night.  . 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 30% LAD in 2007  Rare. [place]  . Coronary artery disease involving native coronary artery of native heart without angina pectoris 04/10/2015   Overview:  30% of LAD in 2007  . Coronary artery stenosis 08/31/2015   Overview:  30% LAD in 2007  . Diabetes mellitus type 2, controlled ([place]) 08/31/2015  . Essential hypertension 08/31/2015  . High risk medication use 08/31/2015  . Insomnia 08/31/2015  . Metatarsalgia of right foot 12/11/2015  . Microscopic hematuria 06/02/2019  . Mixed hyperlipidemia 08/31/2015  . Nicotine dependence, uncomplicated 1/66/0630   Overview:  Former smoker.  Uses vaping  Formatting of this note might be different from the original. Former smoker.  Uses vaping  . Pain of right heel 12/02/2018  . Parkinson's disease ([place]) 08/31/2015  . Plantar fasciitis of right foot 03/11/2016  .  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 Primary osteoarthritis involving multiple joints 09/03/2015  . Sleep apnea 08/31/2015  . Smoking 04/10/2015  . Vitamin B12 deficiency 09/03/2015   Past Surgical  History:  Past Surgical History:  Procedure Laterality Date  . BACK SURGERY    . CATARACT EXTRACTION    . heel Right   . IR ANGIO INTRA EXTRACRAN SEL INTERNAL CAROTID UNI R MOD SED  12/17/2019  . IR ANGIO VERTEBRAL SEL SUBCLAVIAN INNOMINATE BILAT MOD SED  12/17/2019  . IR CT HEAD LTD  12/17/2019  . IR PERCUTANEOUS ART THROMBECTOMY/INFUSION INTRACRANIAL INC DIAG ANGIO  12/17/2019  . KNEE SURGERY Right   . RADIOLOGY WITH ANESTHESIA N/A 12/17/2019   Procedure: IR WITH ANESTHESIA;  Surgeon: Radiologist, Medication, MD;  Location: [facility];  Service: Radiology;  Laterality: N/A;  . SHOULDER SURGERY Left    HPI:   79 y.o. male with PMH parkinson's disease, HTN, COPD ( baseline 2L O2), DM2, CAD who presented to [facility] ED as a code stroke for c/o left facial droop right gaze. Pt fell and hit the back of his head, reporting some dizziness but no LOC. Later became dizzy again with facial droop and called EMS. CTH demonstrating acute nonhemorrhagic infarct involving the right lentiform nucleus, crossing the internal capsule and portions of the right caudate head. Acute nonhemorrhagic infarct involving the posterior right insular ribbon. Infarct may involve portions of the temporal lobe. S/P emergent mechanical thrombectomy of right MCA 12/17/19. Intubated for procedure 6/19, extubated 6/20.    Assessment / Plan / Recommendation Clinical Impression  Pt presents with cognitive deficits s/p right CVA; however, degree of impairments is difficult to assess given severity of hearing loss.  Pt presents with general orientation to time/surroundings.  Speech is clear and fluent; there is mild left lower facial asymmetry.  Cognition is marked by tangential output and inattention, difficulty following two-step commands, and decreased short-term recall and awareness. Spoke with pt's cousin, Lorna Dibble, who has located his hearing aids and will have a family member deliver them in the next 24 hours.  SLP will follow while pt remains in  acute care - agree that he would benefit from CIR stay to facilitate return to independence.      SLP Assessment  SLP Recommendation/Assessment: Patient needs continued Speech Lanaguage Pathology Services SLP Visit Diagnosis: Cognitive communication deficit (R41.841)    Follow Up Recommendations  Inpatient Rehab    Frequency and Duration min 2x/week  2 weeks      SLP Evaluation Cognition  Overall Cognitive Status: Impaired/Different from baseline Arousal/Alertness: Awake/alert Orientation Level: Oriented to person;Oriented to place;Oriented to situation Attention: Sustained Sustained Attention: Appears intact Memory: Impaired Memory Impairment: Storage deficit Awareness: Appears intact Problem Solving: Impaired Problem Solving Impairment: Verbal basic Safety/Judgment: Impaired       Comprehension  Auditory Comprehension Overall Auditory Comprehension: Appears within functional limits for tasks assessed Commands: Impaired Reading Comprehension Reading Status: Not tested    Expression Verbal Expression Overall Verbal Expression: Appears within functional limits for tasks assessed Written Expression Dominant Hand: Right Written Expression: Not tested   Oral / Motor  Oral Motor/Sensory Function Overall Oral Motor/Sensory Function: Mild impairment Facial Symmetry: Abnormal symmetry left;Suspected CN VII (facial) dysfunction Motor Speech Overall Motor Speech: Appears within functional limits for tasks assessed   GO                    Juan Quam Laurice 12/19/2019, 4:33 PM  Estill Bamberg L. Neddie Steedman,  MA CCC/SLP Acute Rehabilitation Services Office number [phone] Pager [phone]

## 2019-12-20 LAB — GLUCOSE, CAPILLARY
Glucose-Capillary: 110 mg/dL — ABNORMAL HIGH (ref 70–99)
Glucose-Capillary: 97 mg/dL (ref 70–99)
Glucose-Capillary: 121 mg/dL — ABNORMAL HIGH (ref 70–99)
Glucose-Capillary: 93 mg/dL (ref 70–99)
Glucose-Capillary: 196 mg/dL — ABNORMAL HIGH (ref 70–99)
Glucose-Capillary: 92 mg/dL (ref 70–99)

## 2019-12-20 NOTE — Progress Notes (Signed)
Physical Therapy Treatment Patient Details Name: Allen Barnes MRN: [id number] DOB: [birth year] Today's Date: 12/20/2019    History of Present Illness 79 y.o. male  With PMH parkinson's disease, HTN, COPD ( baseline 2L O2), DM2, CAD who presented to [facility] ED as a code stroke for c/o left facial droop right gaze. Pt fell and hit the back of his head, reporting some dizziness but no LOC. Later became dizzy again with facial droop and called EMS. CTH demonstrating acute nonhemorrhagic infarct involving the right lentiform    PT Comments    Pt with progressed ambulation distance this day, still remains with very poor safety awareness and attention to L side of environment due to visual impairments. PT cued pt throughout mobility for attention to L and safe hallway navigation. Per chart review, pt and family desire SNF level of care post-acutely, recommendations updated to reflect this.    Follow Up Recommendations  SNF     Equipment Recommendations  Rolling walker with 5" wheels    Recommendations for Other Services       Precautions / Restrictions Precautions Precautions: Fall Precaution Comments: L hemianopia Restrictions Weight Bearing Restrictions: No    Mobility  Bed Mobility Overal bed mobility: Needs Assistance Bed Mobility: Supine to Sit;Sit to Supine     Supine to sit: Min guard Sit to supine: Min guard   General bed mobility comments: for safety, increased time and effort especially bringing LEs into bed.  Transfers Overall transfer level: Needs assistance Equipment used: Rolling walker (2 wheeled) Transfers: Sit to/from Stand Sit to Stand: Min assist         General transfer comment: Min assist to power up, steady, cuing for proper hand placement.  Ambulation/Gait Ambulation/Gait assistance: Min assist Gait Distance (Feet): 150 Feet (+100) Assistive device: Rolling walker (2 wheeled) Gait Pattern/deviations: Step-through pattern;Decreased stride length;Wide  base of support;Staggering left Gait velocity: decr   General Gait Details: Min assist to steady, guide pt and RW. Pt bumping into objects on L multiple times even with max multimodal cuing and PT physically guiding RW. Pt responds best to pointing out obstacles at least 15 ft away so he can course correct appropraitely. Verbal cuing for upright posture   Stairs             Wheelchair Mobility    Modified Rankin (Stroke Patients Only) Modified Rankin (Stroke Patients Only) Pre-Morbid Rankin Score: No symptoms Modified Rankin: Moderately severe disability     Balance Overall balance assessment: Needs assistance   Sitting balance-Leahy Scale: Fair     Standing balance support: Bilateral upper extremity supported Standing balance-Leahy Scale: Poor Standing balance comment: UE support for balance                            Cognition Arousal/Alertness: Awake/alert Behavior During Therapy: WFL for tasks assessed/performed Overall Cognitive Status: Impaired/Different from baseline Area of Impairment: Attention;Memory;Following commands;Safety/judgement;Awareness;Problem solving                   Current Attention Level: Selective Memory: Decreased short-term memory Following Commands: Follows one step commands with increased time;Follows one step commands consistently Safety/Judgement: Decreased awareness of safety;Decreased awareness of deficits   Problem Solving: Slow processing;Requires verbal cues;Difficulty sequencing General Comments: repeated cuing for scanning environment towards L, pt unable without max cuing. Requires multimodal cuing for safety during mobility, often repeated cuing due to [facility].      Exercises  General Comments        Pertinent Vitals/Pain Pain Assessment: No/denies pain    Home Living                      Prior Function            PT Goals (current goals can now be found in the care plan section)  Acute Rehab PT Goals PT Goal Formulation: With patient Time For Goal Achievement: 01/01/20 Potential to Achieve Goals: Good Progress towards PT goals: Progressing toward goals    Frequency    Min 4X/week      PT Plan Discharge plan needs to be updated    Co-evaluation              AM-PAC PT "6 Clicks" Mobility   Outcome Measure  Help needed turning from your back to your side while in a flat bed without using bedrails?: A Little Help needed moving from lying on your back to sitting on the side of a flat bed without using bedrails?: A Little Help needed moving to and from a bed to a chair (including a wheelchair)?: A Little Help needed standing up from a chair using your arms (e.g., wheelchair or bedside chair)?: A Little Help needed to walk in hospital room?: A Little Help needed climbing 3-5 steps with a railing? : A Lot 6 Click Score: 17    End of Session Equipment Utilized During Treatment: Gait belt Activity Tolerance: Patient tolerated treatment well Patient left: with call bell/phone within reach;in bed;with bed alarm set Nurse Communication: Mobility status PT Visit Diagnosis: Unsteadiness on feet (R26.81);Muscle weakness (generalized) (M62.81);Other symptoms and signs involving the nervous system (R29.898)     Time: 6440-3474 PT Time Calculation (min) (ACUTE ONLY): 18 min  Charges:  $Gait Training: 8-22 mins                     Kamylah Manzo E, PT [place] Pager [phone]  Office [phone]    Roxine Caddy D Elonda Husky 12/20/2019, 4:33 PM

## 2019-12-20 NOTE — Progress Notes (Signed)
Inpatient Rehabilitation Admissions Coordinator  I met with patient at bedside for rehab assessment. He requested I call his sister, Sissy. I contacted Sissy by phone as well as brother Elenore Rota. Patient does not have the projected 24/7 supervision that we would recommend after a Short CIR admit and family is requesting SNF rehab in [place], either [facility] or [facility]. I will notify TOC team and we will sign off at this time.   Danne Baxter, RN, MSN Rehab Admissions Coordinator [phone] 12/20/2019 1:02 PM

## 2019-12-20 NOTE — TOC Initial Note (Signed)
Transition of Care [facility]) - Initial/Assessment Note    Patient Details  Name: Allen Barnes MRN: [id number] Date of Birth: 11-18-1940  [facility] of [facility]) CM/SW Contact:    Emeterio Reeve, [place] Phone Number: 12/20/2019, 4:02 PM  Clinical Narrative:                  CSW met with pt bedside. Pt is not oriented. CSW called pts brother Elenore Rota. Elenore Rota stated that PTA pt was independent and lives alone. Elenore Rota stated that for the past couple weeks pt was becoming weaker and was falling a lot.   CSW reviewed pt/ot reccs of SNF. Elenore Rota states he ok with that plan. Elenore Rota stated that for him to go home he has to be able to take care of himself. Elenore Rota stated him and the other siblings will support him but they cant help him if he cant walk or get around.   Elenore Rota stated him and his sister have discussed [facility] and [facility]. CSW informed Elenore Rota that she will fax pt out to those facilities and follow back up.  Expected Discharge Plan: Skilled Nursing Facility Barriers to Discharge: Continued Medical Work up   Patient Goals and CMS Choice Patient states their goals for this hospitalization and ongoing recovery are:: Pt was unable to verbalize goals CMS Medicare.gov Compare Post Acute Care list provided to:: Patient Choice offered to / list presented to : Patient  Expected Discharge Plan and Services Expected Discharge Plan: [place]       Living arrangements for the past 2 months: Single Family Home                                      Prior Living Arrangements/Services Living arrangements for the past 2 months: Single Family Home Lives with:: Self Patient language and need for interpreter reviewed:: Yes Do you feel safe going back to the place where you live?: Yes      Need for Family Participation in Patient Care: Yes (Comment) Care giver support system in place?: Yes (comment) Current home services: DME Criminal Activity/Legal Involvement Pertinent to  Current Situation/Hospitalization: No - Comment as needed  Activities of Daily Living Home Assistive Devices/Equipment: None ADL Screening (condition at time of admission) Patient's cognitive ability adequate to safely complete daily activities?: No Is the patient deaf or have difficulty hearing?: Yes Does the patient have difficulty seeing, even when wearing glasses/contacts?: No Does the patient have difficulty concentrating, remembering, or making decisions?: No Patient able to express need for assistance with ADLs?: No Does the patient have difficulty dressing or bathing?: No Independently performs ADLs?: Yes (appropriate for developmental age) Does the patient have difficulty walking or climbing stairs?: No Weakness of Legs: None Weakness of Arms/Hands: None  Permission Sought/Granted Permission sought to share information with : Family Supports, Chartered certified accountant granted to share information with : Yes, Verbal Permission Granted     Permission granted to share info w AGENCY: SNF        Emotional Assessment Appearance:: Appears stated age Attitude/Demeanor/Rapport: Other (comment) Affect (typically observed): Appropriate Orientation: : Oriented to Situation, Oriented to  Time, Oriented to Place, Oriented to Self Alcohol / Substance Use: Not Applicable Psych Involvement: No (comment)  Admission diagnosis:  Fall [W19.XXXA] Stroke (cerebrum) ([place]) [I63.9] Acute ischemic stroke [facility]) [I63.9] Patient Active Problem List   Diagnosis Date Noted  . Acute ischemic stroke ([place]) 12/17/2019  .  Encounter for intubation   . COPD exacerbation ([place])   . Microscopic hematuria 06/02/2019  . Atherosclerosis of abdominal aorta ([place]) 02/21/2019  . 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 Pain of right heel 12/02/2018  . Acquired plantar porokeratosis 11/18/2018  . Atherosclerosis of both carotid arteries 05/18/2018  . Calculus of gallbladder without  cholecystitis without obstruction 05/18/2018  . Nicotine dependence, uncomplicated 75/64/3329  . Plantar fasciitis of right foot 03/11/2016  . Callus of foot 12/11/2015  . Metatarsalgia of right foot 12/11/2015  . Colon polyps 09/03/2015  . Primary osteoarthritis involving multiple joints 09/03/2015  . Vitamin B12 deficiency 09/03/2015  . Anxiety disorder 08/31/2015  . Asthma 08/31/2015  . BPH without urinary obstruction 08/31/2015  . COPD (chronic obstructive pulmonary disease) with chronic bronchitis ([place]) 08/31/2015  . Coronary artery stenosis 08/31/2015  . Diabetes mellitus type 2, controlled ([place]) 08/31/2015  . Essential hypertension 08/31/2015  . High risk medication use 08/31/2015  . Insomnia 08/31/2015  . Mixed hyperlipidemia 08/31/2015  . Parkinson's disease ([place]) 08/31/2015  . Sleep apnea 08/31/2015  . Coronary artery disease involving native coronary artery of native heart without angina pectoris 04/10/2015  . Smoking 04/10/2015   PCP:  Raina Mina., MD Pharmacy:   [facility] [address], [place] [address] [place] [address] Phone: [phone] Fax: [phone]     Social Determinants of Health (SDOH) Interventions    Readmission Risk Interventions No flowsheet data found.   Emeterio Reeve, Latanya Presser, [place] Social Worker [phone]

## 2019-12-20 NOTE — Progress Notes (Signed)
STROKE TEAM PROGRESS NOTE   INTERVAL HISTORY Patient is sitting up in bed.  He has no complaints.  Vital signs are stable.  No neurological changes.  Awaiting rehab transfer after insurance approval  OBJECTIVE Vitals:   12/20/19 0821 12/20/19 0906 12/20/19 1152 12/20/19 1418  BP:  (!) 161/79 (!) 110/56   Pulse: 66 64 75 (!) 59  Resp: 16 18 20 16   Temp:  98.6 F (37 C) 98.1 F (36.7 C)   TempSrc:  Oral Oral   SpO2: 98% 99%  95%  Weight:      Height:        CBC:  Recent Labs  Lab 12/17/19 1354 12/17/19 2054 12/18/19 0829 12/19/19 0522  WBC 8.2  --  7.1 6.1  NEUTROABS 6.4  --   --   --   HGB 12.3*   < > 12.4* 11.8*  HCT 37.9*   < > 37.3* 36.9*  MCV 104.1*  --  103.3* 105.4*  PLT 128*  --  129* 121*   < > = values in this interval not displayed.    Basic Metabolic Panel:  Recent Labs  Lab 12/18/19 0829 12/19/19 0522  NA 141 139  K 3.6 3.8  CL 108 107  CO2 25 25  GLUCOSE 113* 84  BUN 13 10  CREATININE 1.10 1.07  CALCIUM 8.7* 8.2*  MG 1.7 1.6*  PHOS 3.2 4.3    Lipid Panel:     Component Value Date/Time   CHOL 148 12/18/2019 0456   TRIG 111 12/18/2019 0456   TRIG 114 12/18/2019 0456   HDL 56 12/18/2019 0456   CHOLHDL 2.6 12/18/2019 0456   VLDL 22 12/18/2019 0456   LDLCALC 70 12/18/2019 0456   HgbA1c:  Lab Results  Component Value Date   HGBA1C 5.8 (H) 12/18/2019   Urine Drug Screen: No results found for: LABOPIA, COCAINSCRNUR, LABBENZ, AMPHETMU, THCU, LABBARB  Alcohol Level No results found for: ETH  IMAGING past 24h No results found.   PHYSICAL EXAM   GENERAL: Pleasant elderly Caucasian male not in distress.  HEENT: Neck is supple and no trauma noted.    Hearing is impaired significantly.  ABDOMEN: soft  EXTREMITIES: No edema   BACK: Normal  SKIN: Normal by inspection.    MENTAL STATUS: He is awake and alert.  He does converses decently.  He does follow commands briskly.  There is mild dysarthria noted.  CRANIAL NERVES: Pupils are  equal, round and reactive to light; extra ocular movements are full, there is no significant nystagmus; visual fields shows a dense left homonymous hemianopia; upper and lower facial muscles are normal in strength and symmetric, there is no flattening of the nasolabial folds; tongue is midline; uvula is midline; shoulder elevation is normal.  MOTOR: Right upper and lower extremities show 4/5 strength.  Left upper extremity 3/5 and the left lower extremity 4.  Diminished fine finger movements on the left.  Orbits right over left upper extremity.  Left grip weakness.  Mild action tremor of left upper extremity greater than right increase with position holding  COORDINATION: No tremors, dysmetria or myoclonus noted.  SENSATION: Normal to light touch and pain.   ASSESSMENT/PLAN [name] is a 79 y.o. male with history of parkinson's disease, HTN, COPD ( baseline 2L O2), DM2, CAD, 2nd COVID vaccine dose 08/2019, and on 12/15/19 he had a transurethral procedure at [facility] for hematuria-questionable mass in the bladder.  He presented to [facility] ED on 12/17/19 as a  code stroke for c/o left facial droop right gaze. Patient was LKW at 0800 when he fell and hit the back of his head. He was a little dizzy afterwards but no LOC. He did not receive IV t-PA due to late presentation.  Neuro Interventional Radiology - Cerebral Angiogram with Intervention - Dr. Erven Colla de Sindy Messing  12/17/19 7:45 pm Diagnosis: Right ICA chronic occlusion; right M1 acute occlusion Mechanical thrombectomy right MCA (likely TICI2B). Embolus to the distal left A3 noted. Mechanical thrombectomy performed with recanalization up to the A4 segment.   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  Code Stroke CT Head - Acute nonhemorrhagic infarct  involving the right lentiform nucleus, crossing the internal capsule and portions of the right caudate head. Acute nonhemorrhagic infarct involving the posterior right insular ribbon. Infarct may involve portions of the temporal lobe. ASPECTS is 6/10. Stable moderate white matter disease.   MRI head - Patchy multifocal acute ischemic right MCA territory infarcts as above. No associated hemorrhage or mass effect. Abnormal flow void within the right ICA to the cavernous segment, consistent with known chronic right ICA occlusion. Major intracranial vascular flow voids are otherwise maintained. Underlying age-related cerebral atrophy with mild chronic small vessel ischemic disease.   MRA head - not ordered  CTA H&N - Proximal right M1 large vessel occlusion with minimal collaterals. 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 Short-segment reconstitution of the right ICA in the cavernous segment continues to severe stenosis of the terminal right internal carotid artery. The right posterior cerebral artery is fed by a small right posterior communicating artery and small right P1 segment  CT Perfusion -  Right MCA territory infarct with core infarct of 38 mL  2D Echo - EF 60-65%. No source of embolus   [employer] Virus 2 - negative  LDL - 70  HgbA1c - 5.8  UDS - not ordered  VTE prophylaxis - SCDs    aspirin 81 mg daily prior to admission, now on none  Therapy recommendations:  CIR  Disposition:  Pending (lives alone, cousin next door)  Hypertension  Home BP meds: Cozaar  ;  Imdur  Current BP meds: Add losartan 100 mg  Stable . SBP goal 140-160 per IR . Long-term BP goal normotensive  Diabetes, type II controlled  HgbA1c 5.8, at goal < 7.0  Home meds: Aspirin, Xanax, Breo Ellipta, vitamin I14, Colace, garlic, Imdur, Cozaar, oxygen 2 L, Pravachol 20 mg, Symbicort, Flomax  Hyperlipidemia  Home Lipid lowering medication: Pravachol 20 mg daily  LDL 70,  goal < 70  Current lipid lowering medication: Pravachol 20 mg  Continue statin at discharge  Other Stroke Risk Factors  Advanced age  Former cigarette smoker - quit    ETOH use, advised to drink no more than 1 alcoholic beverage per day.  Coronary artery disease  OSA  No hx stroke  Other Active Problems  Aortic Atherosclerosis (ICD10-I70.0)   Emphysema (ERX54-M08.9)  Mild thrombocytopenia  Recent prostate / bladder procedure for hematuria  Recent fall  Parkinson's Disease  Hospital day # 3  Continue aspirin and aggressive risk factor modification .  Continue ongoing therapies.    Anticipate transfer to inpatient rehab in the next few days when bed becomes available after insurance approval.   Antony Contras, MD Medical Director [facility] Pager:  [phone] 12/20/2019 3:23 PM To contact Stroke Continuity provider, please refer to [email or web address] After hours, contact General Neurology

## 2019-12-20 NOTE — Care Management Important Message (Signed)
Important Message  Patient Details  Name: Allen Barnes MRN: [id number] Date of Birth: [birth year]   Medicare Important Message Given:  Yes     Orbie Pyo 12/20/2019, 3:03 PM

## 2019-12-21 LAB — GLUCOSE, CAPILLARY
Glucose-Capillary: 121 mg/dL — ABNORMAL HIGH (ref 70–99)
Glucose-Capillary: 99 mg/dL (ref 70–99)
Glucose-Capillary: 97 mg/dL (ref 70–99)
Glucose-Capillary: 100 mg/dL — ABNORMAL HIGH (ref 70–99)
Glucose-Capillary: 92 mg/dL (ref 70–99)
Glucose-Capillary: 110 mg/dL — ABNORMAL HIGH (ref 70–99)

## 2019-12-21 MED ORDER — IPRATROPIUM-ALBUTEROL 0.5-2.5 (3) MG/3ML IN SOLN
3.0000 mL | Freq: Two times a day (BID) | RESPIRATORY_TRACT | Status: DC
  Administered 2019-12-21 – 2019-12-22 (×2): 3 mL via RESPIRATORY_TRACT
  Filled 2019-12-21 (×2): qty 3

## 2019-12-21 NOTE — Progress Notes (Signed)
STROKE TEAM PROGRESS NOTE   INTERVAL HISTORY Patient is sitting up in bed.  His friend is in the room.  He reports no changes.  He is awaiting skilled nursing facility bed.  Neurological exam stable.  Vital signs are stable.  OBJECTIVE Vitals:   12/20/19 2316 12/21/19 0335 12/21/19 1009 12/21/19 1125  BP: 114/72 129/66 140/76 (!) 153/74  Pulse: 63 (!) 53 (!) 58 (!) 51  Resp: 20 16 18 18   Temp: 99 F (37.2 C) 97.8 F (36.6 C) 98.6 F (37 C) 98.3 F (36.8 C)  TempSrc: Oral  Oral Oral  SpO2: 96% 94% 94% 100%  Weight:      Height:       CBC:  Recent Labs  Lab 12/17/19 1354 12/17/19 2054 12/18/19 0829 12/19/19 0522  WBC 8.2  --  7.1 6.1  NEUTROABS 6.4  --   --   --   HGB 12.3*   < > 12.4* 11.8*  HCT 37.9*   < > 37.3* 36.9*  MCV 104.1*  --  103.3* 105.4*  PLT 128*  --  129* 121*   < > = values in this interval not displayed.   Basic Metabolic Panel:  Recent Labs  Lab 12/18/19 0829 12/19/19 0522  NA 141 139  K 3.6 3.8  CL 108 107  CO2 25 25  GLUCOSE 113* 84  BUN 13 10  CREATININE 1.10 1.07  CALCIUM 8.7* 8.2*  MG 1.7 1.6*  PHOS 3.2 4.3   Lipid Panel:     Component Value Date/Time   CHOL 148 12/18/2019 0456   TRIG 111 12/18/2019 0456   TRIG 114 12/18/2019 0456   HDL 56 12/18/2019 0456   CHOLHDL 2.6 12/18/2019 0456   VLDL 22 12/18/2019 0456   LDLCALC 70 12/18/2019 0456   HgbA1c:  Lab Results  Component Value Date   HGBA1C 5.8 (H) 12/18/2019   Urine Drug Screen: No results found for: LABOPIA, COCAINSCRNUR, LABBENZ, AMPHETMU, THCU, LABBARB  Alcohol Level No results found for: ETH  IMAGING past 24h No results found.   PHYSICAL EXAM     GENERAL: Pleasant elderly Caucasian male not in distress.  HEENT: Neck is supple and no trauma noted.    Hearing is impaired significantly.  ABDOMEN: soft  EXTREMITIES: No edema   BACK: Normal  SKIN: Normal by inspection.    MENTAL STATUS: He is awake and alert.  He does converses decently.  He does follow  commands briskly.  There is mild dysarthria noted.  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  MOTOR: Right upper and lower extremities show 4/5 strength.  Left upper extremity 3/5 and the left lower extremity 4.  Diminished fine finger movements on the left.  Orbits right over left upper extremity.  Left grip weakness.  Mild action tremor of left upper extremity greater than right increase with position holding  COORDINATION: No tremors, dysmetria or myoclonus noted.  SENSATION: Normal to light touch and pain.   ASSESSMENT/PLAN [name] is a 79 y.o. male with history of parkinson's disease, HTN, COPD ( baseline 2L O2), DM2, CAD, 2nd COVID vaccine dose 08/2019, and on 12/15/19 he had a transurethral procedure at [facility] for hematuria-questionable  mass in the bladder.  He presented to [facility] ED on 12/17/19 as a code stroke for c/o left facial droop right gaze. Patient was LKW at 0800 when he fell and hit the back of his head. He was a little dizzy afterwards but no LOC. He did not receive IV t-PA due to late presentation.  Neuro Interventional Radiology - Cerebral Angiogram with Intervention - Dr. Erven Colla de Sindy Messing  12/17/19 7:45 pm Diagnosis: Right ICA chronic occlusion; right M1 acute occlusion Mechanical thrombectomy right MCA (likely TICI2B). Embolus to the distal left A3 noted. Mechanical thrombectomy performed with recanalization up to the A4 segment.   Stroke: Patchy multifocal acute ischemic right MCA territory infarcts in setting of chronic R ICA occlusion - infarct likely thromboembolic from stump embolic  Resultant left homonymous hemianopia and mild  left hemiparesis.  Code Stroke CT Head - Acute nonhemorrhagic infarct involving the right lentiform nucleus, crossing the internal capsule and portions of the right caudate head. Acute nonhemorrhagic infarct involving the posterior right insular ribbon. Infarct may involve portions of the temporal lobe. ASPECTS is 6/10. Stable moderate white matter disease.   MRI head - Patchy multifocal acute ischemic right MCA territory infarcts as above. No associated hemorrhage or mass effect. Abnormal flow void within the right ICA to the cavernous segment, consistent with known chronic right ICA occlusion. Major intracranial vascular flow voids are otherwise maintained. Underlying age-related cerebral atrophy with mild chronic small vessel ischemic disease.   MRA head - not ordered  CTA H&N - Proximal right M1 large vessel occlusion with minimal collaterals. 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 Short-segment reconstitution of the right ICA in the cavernous segment continues to severe stenosis of the terminal right internal carotid artery. The right posterior cerebral artery is fed by a small right posterior communicating artery and small right P1 segment  CT Perfusion -  Right MCA territory infarct with core infarct of 38 mL  2D Echo - EF 60-65%. No source of embolus   [employer] Virus 2 - negative  LDL - 70  HgbA1c - 5.8  UDS - not ordered  VTE prophylaxis - SCDs    aspirin 81 mg daily prior to admission, now on aspirin 81  Therapy recommendations:  CIR->pt prefers SNF  Disposition:  SNF Planned (lives alone, cousin next door)  Hypertension  Home BP meds: Cozaar  ;  Imdur  Resumed cozaar  Stable . SBP goal <180 . Monitor for imdur resumption . Long-term BP goal normotensive  Diabetes, type II controlled  HgbA1c 5.8, at goal < 7.0  Hyperlipidemia  Home Lipid lowering medication: Pravachol 20 mg daily  LDL 70, goal < 70  Current lipid lowering  medication: Pravachol 20 mg  Continue statin at discharge  Other Stroke Risk Factors  Advanced age  Former cigarette smoker - quit    ETOH use, advised to drink no more than 1 alcoholic beverage per day.  Coronary artery disease  OSA  No hx stroke  Other Active Problems  Aortic Atherosclerosis (ICD10-I70.0)   Emphysema (TMH96-Q22.9)  Mild thrombocytopenia  Recent prostate / bladder procedure for hematuria - had foley on arrival. Will keep at d/c.  Recent fall  Parkinson's Disease  Hospital day # 4 Continue ongoing therapies.  Patient is medically stable to be transferred to skilled nursing facility when bed becomes available.  Discussed with patient and friend and answered questions  Antony Contras, MD Medical Director [place] Pager: [phone] 12/21/2019  2:32 PM To contact Stroke Continuity provider, please refer to [email or web address] After hours, contact General Neurology

## 2019-12-21 NOTE — Progress Notes (Signed)
Occupational Therapy Treatment Patient Details Name: Allen Barnes MRN: [id number] DOB: Jun 15, 1941 Today's Date: 12/21/2019    History of present illness 79 y.o. male  With PMH parkinson's disease, HTN, COPD ( baseline 2L O2), DM2, CAD who presented to [facility] ED as a code stroke for c/o left facial droop right gaze. Pt fell and hit the back of his head, reporting some dizziness but no LOC. Later became dizzy again with facial droop and called EMS. CTH demonstrating acute nonhemorrhagic infarct involving the right lentiform   OT comments  Pt received seated in recliner starring out to window on R side. Pt unable to see therapist until therapist positioned directly in front of pt. Pt very HOH therefore decreased awareness could be secondary to hearing impairments although pt continues to present with L hemianopsia. Pt with RUE tremors at baseline but pt presents with LUE functional ROM able to achieve ~ 90*. Pt reports able to self feed with LUE. Session focus on table top visual scanning activities with pt needing MAX multimodal cues to scan completely to L to locate needed items. Education provided on scanning to L when staff enters room, pt agreeable. Continue to recommend CIR, will follow.   Follow Up Recommendations  CIR;Supervision/Assistance - 24 hour    Equipment Recommendations  3 in 1 bedside commode    Recommendations for Other Services      Precautions / Restrictions Precautions Precautions: Fall Precaution Comments: L hemianopia Restrictions Weight Bearing Restrictions: No       Mobility Bed Mobility               General bed mobility comments: pt OOB in chair  Transfers                      Balance                                           ADL either performed or assessed with clinical judgement   ADL Overall ADL's : Needs assistance/impaired                                       General ADL Comments: session focus  on table top visual scanning task with pt needing MAX cues to locate items on L side of paper     Vision Baseline Vision/History: No visual deficits Patient Visual Report: No change from baseline Vision Assessment?: Vision impaired- to be further tested in functional context Visual Fields: Left homonymous hemianopsia Additional Comments: Pt continues to present with L homonymous hemianopsia needing MAX cues to locate items on L side during table top activity. Pt does seem to have some awarenesss to L upper quadrant but no awareness to L lower quadrant. utilized tactile cues to facilitate head turn to L. Encouraged pt to look to door way when pt hears staff enter ( although difficult as pt is very [place]) as pt noted to stare out windor on R upon arrival and did not see therapist until directly in front of pt   Perception     Praxis      Cognition Arousal/Alertness: Awake/alert Behavior During Therapy: WFL for tasks assessed/performed Overall Cognitive Status: Impaired/Different from baseline Area of Impairment: Attention;Following commands;Awareness;Problem solving;Safety/judgement  Current Attention Level: Selective   Following Commands: Follows one step commands with increased time;Follows one step commands consistently (likely secondary to [facility]) Safety/Judgement: Decreased awareness of deficits Awareness: Intellectual Problem Solving: Slow processing;Requires verbal cues;Difficulty sequencing;Decreased initiation;Requires tactile cues General Comments: pt requires repetition of all cues        Exercises     Shoulder Instructions       General Comments pt on 2L O2 throughout session    Pertinent Vitals/ Pain       Faces Pain Scale: Hurts a little bit Pain Location: headache Pain Descriptors / Indicators: Discomfort;Aching Pain Intervention(s): Monitored during session  Home Living                                          Prior  Functioning/Environment              Frequency  Min 2X/week        Progress Toward Goals  OT Goals(current goals can now be found in the care plan section)  Progress towards OT goals: Progressing toward goals  Acute Rehab OT Goals Patient Stated Goal: To improve mobility and aide in a return to independence OT Goal Formulation: With patient Time For Goal Achievement: 01/02/20 Potential to Achieve Goals: Good  Plan Discharge plan remains appropriate;Frequency remains appropriate    Co-evaluation                 AM-PAC OT "6 Clicks" Daily Activity     Outcome Measure   Help from another person eating meals?: A Little Help from another person taking care of personal grooming?: A Little Help from another person toileting, which includes using toliet, bedpan, or urinal?: A Lot Help from another person bathing (including washing, rinsing, drying)?: A Lot Help from another person to put on and taking off regular upper body clothing?: A Little Help from another person to put on and taking off regular lower body clothing?: A Lot 6 Click Score: 15    End of Session    OT Visit Diagnosis: Unsteadiness on feet (R26.81);Cognitive communication deficit (R41.841);Low vision, both eyes (H54.2) Symptoms and signs involving cognitive functions: Cerebral infarction   Activity Tolerance Patient tolerated treatment well   Patient Left in chair;with call bell/phone within reach;with chair alarm set   Nurse Communication Mobility status        Time: 6153-7943 OT Time Calculation (min): 17 min  Charges: OT General Charges $OT Visit: 1 Visit OT Treatments $Therapeutic Activity: 8-22 mins  Lanier Clam., COTA/L Acute Rehabilitation Services [phone]    Ihor Gully 12/21/2019, 4:56 PM

## 2019-12-22 LAB — GLUCOSE, CAPILLARY
Glucose-Capillary: 101 mg/dL — ABNORMAL HIGH (ref 70–99)
Glucose-Capillary: 103 mg/dL — ABNORMAL HIGH (ref 70–99)
Glucose-Capillary: 138 mg/dL — ABNORMAL HIGH (ref 70–99)
Glucose-Capillary: 73 mg/dL (ref 70–99)
Glucose-Capillary: 84 mg/dL (ref 70–99)

## 2019-12-22 MED ORDER — CLOPIDOGREL BISULFATE 75 MG PO TABS: 75.0000 mg | ORAL_TABLET | Freq: Every day | ORAL | Status: DC

## 2019-12-22 MED ORDER — CLOPIDOGREL BISULFATE 75 MG PO TABS
75.0000 mg | ORAL_TABLET | Freq: Every day | ORAL | Status: DC
  Administered 2019-12-22: 75 mg via ORAL
  Filled 2019-12-22: qty 1

## 2019-12-22 MED ORDER — ASPIRIN EC 81 MG PO TBEC: 81.0000 mg | DELAYED_RELEASE_TABLET | Freq: Every day | ORAL | 11 refills | Status: AC

## 2019-12-22 NOTE — Plan of Care (Signed)

## 2019-12-22 NOTE — Progress Notes (Signed)
Nurse tech report that a wallet was seen in the patient's room, when asking the patient what he had in the wallet the patient stated "A lot of money". Nurse tech asked if she could put the wallet away in a safe place for the patient, patient stated no and put the wallet under his blanket in the room. When this nurse asked about the wallet the patient repeatedly stated that he does not have a wallet and does not know what I am talking about. Patient is alert and oriented x4.

## 2019-12-22 NOTE — Discharge Summary (Addendum)
Stroke Discharge Summary  Patient ID: Allen Barnes   MRN: [id number]      DOB: [birth year]  Date of Admission: 12/17/2019 Date of Discharge: 12/22/2019  Attending Physician:  Garvin Fila, MD, Stroke MD Consultant(s):     Pedro Earls MD (Interventional Neuroradiologist), Terrence Dupont MD ( pulmonary/intensive care ), Alysia Penna, MD (Physical Medicine & Rehabilitation)  Patient's PCP:  Raina Mina., MD  DISCHARGE DIAGNOSIS:  Principal Problem:   Acute ischemic stroke [facility]) R MCA s/p mechanical thrombectomy. thromboembolic source from R ICA   Allergies as of 12/22/2019       Reactions   Carbidopa-levodopa Nausea And Vomiting   Mainly vomiting   Tramadol Rash        Medication List     STOP taking these medications    ALPRAZolam 1 MG tablet Commonly known as: XANAX   Garlic 1761 MG Caps   levofloxacin 250 MG tablet Commonly known as: LEVAQUIN       TAKE these medications    acetaminophen 500 MG tablet Commonly known as: TYLENOL Take 500 mg by mouth 2 (two) times daily.   aspirin EC 81 MG tablet Take 1 tablet (81 mg total) by mouth at bedtime for 21 days. After 21 days, continue plavix alone What changed: additional instructions   Breo Ellipta 200-25 MCG/INH Aepb Generic drug: fluticasone furoate-vilanterol Inhale 1 puff into the lungs daily.   clopidogrel 75 MG tablet Commonly known as: PLAVIX Take 1 tablet (75 mg total) by mouth daily.   docusate sodium 50 MG capsule Commonly known as: COLACE Take 50 mg by mouth as needed for mild constipation.   isosorbide mononitrate 30 MG 24 hr tablet Commonly known as: IMDUR Take 1 tablet (30 mg total) by mouth daily.   losartan 100 MG tablet Commonly known as: COZAAR Take 100 mg by mouth daily.   nitroGLYCERIN 0.4 MG SL tablet Commonly known as: NITROSTAT Place 1 tablet (0.4 mg total) under the tongue every 5 (five) minutes as needed for chest pain.   OXYGEN Inhale 2 L into  the lungs at bedtime.   pravastatin 20 MG tablet Commonly known as: PRAVACHOL Take 20 mg by mouth daily.   RA Vitamin B-12 TR 1000 MCG Tbcr Generic drug: Cyanocobalamin Take 1 tablet by mouth daily.   Symbicort 160-4.5 MCG/ACT inhaler Generic drug: budesonide-formoterol Inhale 2 puffs into the lungs 2 (two) times daily.   tamsulosin 0.4 MG Caps capsule Commonly known as: FLOMAX 0.4 mg 2 (two) times daily.   triamcinolone cream 0.1 % Commonly known as: KENALOG Apply 1 application topically 2 (two) times daily as needed.        LABORATORY STUDIES CBC    Component Value Date/Time   WBC 6.1 12/19/2019 0522   RBC 3.50 (L) 12/19/2019 0522   HGB 11.8 (L) 12/19/2019 0522   HCT 36.9 (L) 12/19/2019 0522   PLT 121 (L) 12/19/2019 0522   MCV 105.4 (H) 12/19/2019 0522   MCH 33.7 12/19/2019 0522   MCHC 32.0 12/19/2019 0522   RDW 12.7 12/19/2019 0522   LYMPHSABS 0.9 12/17/2019 1354   MONOABS 0.8 12/17/2019 1354   EOSABS 0.1 12/17/2019 1354   BASOSABS 0.0 12/17/2019 1354   CMP    Component Value Date/Time   NA 139 12/19/2019 0522   K 3.8 12/19/2019 0522   CL 107 12/19/2019 0522   CO2 25 12/19/2019 0522   GLUCOSE 84 12/19/2019 0522   BUN 10 12/19/2019 0522  CREATININE 1.07 12/19/2019 0522   CALCIUM 8.2 (L) 12/19/2019 0522   PROT 5.6 (L) 12/17/2019 1354   ALBUMIN 3.4 (L) 12/17/2019 1354   AST 21 12/17/2019 1354   ALT 15 12/17/2019 1354   ALKPHOS 38 12/17/2019 1354   BILITOT 1.0 12/17/2019 1354   GFRNONAA >60 12/19/2019 0522   GFRAA >60 12/19/2019 0522   COAGS Lab Results  Component Value Date   INR 1.1 12/17/2019   Lipid Panel    Component Value Date/Time   CHOL 148 12/18/2019 0456   TRIG 111 12/18/2019 0456   TRIG 114 12/18/2019 0456   HDL 56 12/18/2019 0456   CHOLHDL 2.6 12/18/2019 0456   VLDL 22 12/18/2019 0456   LDLCALC 70 12/18/2019 0456   HgbA1C  Lab Results  Component Value Date   HGBA1C 5.8 (H) 12/18/2019   Urinalysis No results found for:  COLORURINE, APPEARANCEUR, LABSPEC, PHURINE, GLUCOSEU, HGBUR, BILIRUBINUR, KETONESUR, PROTEINUR, UROBILINOGEN, NITRITE, LEUKOCYTESUR Urine Drug Screen No results found for: LABOPIA, COCAINSCRNUR, LABBENZ, AMPHETMU, THCU, LABBARB  Alcohol Level No results found for: [facility]   SIGNIFICANT DIAGNOSTIC STUDIES CT Code Stroke CTA Head W/WO contrast  Result Date: 12/17/2019 CLINICAL DATA:  Left-sided facial droop.  Abnormal CT scan. EXAM: CT ANGIOGRAPHY HEAD AND NECK CT PERFUSION BRAIN TECHNIQUE: Multidetector CT imaging of the head and neck was performed using the standard protocol during bolus administration of intravenous contrast. Multiplanar CT image reconstructions and MIPs were obtained to evaluate the vascular anatomy. Carotid stenosis measurements (when applicable) are obtained utilizing NASCET criteria, using the distal internal carotid diameter as the denominator. Multiphase CT imaging of the brain was performed following IV bolus contrast injection. Subsequent parametric perfusion maps were calculated using RAPID software. CONTRAST:  OMNIPAQUE IOHEXOL 350 MG/ML SOLN COMPARISON:  CT head without contrast 12/17/2019 FINDINGS: CTA NECK FINDINGS Aortic arch: A 3 vessel arch configuration is present. Atherosclerotic changes are present in the arch and at the origins of the innominate and subclavian arteries without significant stenosis or aneurysm. Right carotid system: Minimal wall calcifications are present within the right common carotid artery. Dense calcifications are present bifurcation. The right ICA is occluded without reconstitution in the neck. Left carotid system: Left carotid artery demonstrates scattered mural calcifications. Lumen is narrowed to 3.5 mm. No significant stenosis of greater than 50% is present. Moderate tortuosity is present within the cervical left ICA without other stenosis. Vertebral arteries: The vertebral arteries originate from the subclavian arteries bilaterally. The  right vertebral artery is the dominant vessel. Mild proximal stenosis of less than 50% are present bilaterally. No additional stenoses are present in either vertebral artery in the neck. Skeleton: Multilevel degenerative changes are present. No focal lytic or blastic lesions are present. Patient is edentulous. Other neck: Soft tissues the neck are otherwise unremarkable. Salivary glands are within normal limits. No significant adenopathy is present. Thyroid is normal. Upper chest: Centrilobular emphysematous changes are present. Focal nodule or mass lesion is present. Thoracic inlet is normal. Review of the MIP images confirms the above findings CTA HEAD FINDINGS Anterior circulation: Minimal contrast is present in the cavernous right internal carotid artery from the ophthalmic segment to the posterior communicating artery. There is severe narrowing of the terminal right ICA. Atherosclerotic changes are present the cavernous left ICA without focal stenosis through the ICA terminus. The left A1 and M1 segments are normal. The anterior communicating artery is patent. Contrast is present in the right A1 segment. Right M1 segment is occluded. Minimal collaterals are present. Left  MCA branch vessels and bilateral ACA branch vessels are within normal limits. Posterior circulation: The right vertebral artery is slightly dominant. PICA origins are visualized and normal. Vertebrobasilar junction is normal. Basilar artery is normal. Left posterior cerebral artery originates from basilar tip. The right posterior cerebral artery is fed by a small right posterior communicating artery and small right P1 segment. PCA branch vessels are intact bilaterally. Venous sinuses: The dural sinuses are patent. The straight sinus and deep cerebral veins are intact. Cortical veins are unremarkable. No vascular malformation is present. Anatomic variants: Fetal type right posterior cerebral artery remains patent with a small right P1 segment  Review of the MIP images confirms the above findings CT Brain Perfusion Findings: ASPECTS: 6/10 CBF (<30%) Volume: 47m Perfusion (Tmax>6.0s) volume: 1987mMismatch Volume: 1574mnfarction Location:Right MCA territory IMPRESSION: 1. 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 2. Proximal right M1 large vessel occlusion with minimal collaterals. 3. 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 4. Short-segment reconstitution of the right ICA in the cavernous segment continues to severe stenosis of the terminal right internal carotid artery. 5. The right posterior cerebral artery is fed by a small right posterior communicating artery and small right P1 segment. 6. Aortic Atherosclerosis (ICD10-I70.0) and Emphysema (ICD10-J43.9). 7. Multilevel degenerative changes in the cervical spine. These results were called by telephone at the time of interpretation on 12/17/2019 at 2:35 pm to provider AroRory Percyho verbally acknowledged these results. Electronically Signed   By: ChrSan MorelleD.   On: 12/17/2019 15:10   CT Code Stroke CTA Neck W/WO contrast  Result Date: 12/17/2019 CLINICAL DATA:  Left-sided facial droop.  Abnormal CT scan. EXAM: CT ANGIOGRAPHY HEAD AND NECK CT PERFUSION BRAIN TECHNIQUE: Multidetector CT imaging of the head and neck was performed using the standard protocol during bolus administration of intravenous contrast. Multiplanar CT image reconstructions and MIPs were obtained to evaluate the vascular anatomy. Carotid stenosis measurements (when applicable) are obtained utilizing NASCET criteria, using the distal internal carotid diameter as the denominator. Multiphase CT imaging of the brain was performed following IV bolus contrast injection. Subsequent parametric perfusion maps were calculated using RAPID software. CONTRAST:  100m34mNIPAQUE IOHEXOL 350 MG/ML SOLN COMPARISON:  CT head without contrast 12/17/2019 FINDINGS: CTA NECK FINDINGS Aortic arch: A 3  vessel arch configuration is present. Atherosclerotic changes are present in the arch and at the origins of the innominate and subclavian arteries without significant stenosis or aneurysm. Right carotid system: Minimal wall calcifications are present within the right common carotid artery. Dense calcifications are present bifurcation. The right ICA is occluded without reconstitution in the neck. Left carotid system: Left carotid artery demonstrates scattered mural calcifications. Lumen is narrowed to 3.5 mm. No significant stenosis of greater than 50% is present. Moderate tortuosity is present within the cervical left ICA without other stenosis. Vertebral arteries: The vertebral arteries originate from the subclavian arteries bilaterally. The right vertebral artery is the dominant vessel. Mild proximal stenosis of less than 50% are present bilaterally. No additional stenoses are present in either vertebral artery in the neck. Skeleton: Multilevel degenerative changes are present. No focal lytic or blastic lesions are present. Patient is edentulous. Other neck: Soft tissues the neck are otherwise unremarkable. Salivary glands are within normal limits. No significant adenopathy is present. Thyroid is normal. Upper chest: Centrilobular emphysematous changes are present. Focal nodule or mass lesion is present. Thoracic inlet is normal. Review of the MIP images confirms the above findings  CTA HEAD FINDINGS Anterior circulation: Minimal contrast is present in the cavernous right internal carotid artery from the ophthalmic segment to the posterior communicating artery. There is severe narrowing of the terminal right ICA. Atherosclerotic changes are present the cavernous left ICA without focal stenosis through the ICA terminus. The left A1 and M1 segments are normal. The anterior communicating artery is patent. Contrast is present in the right A1 segment. Right M1 segment is occluded. Minimal collaterals are present. Left  MCA branch vessels and bilateral ACA branch vessels are within normal limits. Posterior circulation: The right vertebral artery is slightly dominant. PICA origins are visualized and normal. Vertebrobasilar junction is normal. Basilar artery is normal. Left posterior cerebral artery originates from basilar tip. The right posterior cerebral artery is fed by a small right posterior communicating artery and small right P1 segment. PCA branch vessels are intact bilaterally. Venous sinuses: The dural sinuses are patent. The straight sinus and deep cerebral veins are intact. Cortical veins are unremarkable. No vascular malformation is present. Anatomic variants: Fetal type right posterior cerebral artery remains patent with a small right P1 segment Review of the MIP images confirms the above findings CT Brain Perfusion Findings: ASPECTS: 6/10 CBF (<30%) Volume: 22m Perfusion (Tmax>6.0s) volume: 1958mMismatch Volume: 15738mnfarction Location:Right MCA territory IMPRESSION: 1. 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 2. Proximal right M1 large vessel occlusion with minimal collaterals. 3. 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 4. Short-segment reconstitution of the right ICA in the cavernous segment continues to severe stenosis of the terminal right internal carotid artery. 5. The right posterior cerebral artery is fed by a small right posterior communicating artery and small right P1 segment. 6. Aortic Atherosclerosis (ICD10-I70.0) and Emphysema (ICD10-J43.9). 7. Multilevel degenerative changes in the cervical spine. These results were called by telephone at the time of interpretation on 12/17/2019 at 2:35 pm to provider AroRory Percyho verbally acknowledged these results. Electronically Signed   By: ChrSan MorelleD.   On: 12/17/2019 15:10   MR BRAIN WO CONTRAST  Result Date: 12/18/2019 CLINICAL DATA:  Follow-up examination for acute stroke. Status post catheter directed  intervention. EXAM: MRI HEAD WITHOUT CONTRAST TECHNIQUE: Multiplanar, multiecho pulse sequences of the brain and surrounding structures were obtained without intravenous contrast. COMPARISON:  Prior studies from 12/17/2019. FINDINGS: Brain: Generalized age-related cerebral atrophy. Scattered patchy T2/FLAIR hyperintensity within the periventricular and deep white matter both cerebral hemispheres most consistent with chronic small vessel ischemic disease, mild in nature. Patchy multifocal areas of restricted diffusion seen involving the right MCA distribution, primarily involving the right insula and adjacent right temporal lobe, with extension to involve the posterior right frontoparietal region. Involvement of the right caudate and lentiform nuclei present as well. No associated hemorrhage or mass effect. Scattered FLAIR hyperintensity seen within several cortical sulci within the area of infarction without susceptibility artifact, likely related to vascular congestion. Few additional punctate foci of diffusion abnormality noted at the cortical and subcortical posterior left frontal region (series 5, image 88). No other areas of acute or subacute ischemia. Gray-white matter differentiation otherwise maintained. No evidence for acute intracranial hemorrhage. Vague FLAIR signal intensity partially surrounding the midbrain and pons noted without associated SWI correlate, favored to be artifactual in nature. Single punctate chronic microhemorrhage noted within the left frontal centrum semi ovale, of doubtful significance in isolation. No mass lesion, midline shift or mass effect. No hydrocephalus. No extra-axial fluid collection. Pituitary gland suprasellar region within normal limits. Midline  structures intact. Vascular: Abnormal flow void within the right ICA to the cavernous segment, consistent with known chronic right ICA occlusion. Otherwise, major intracranial vascular flow voids are maintained, with particular  note made of preserved flow voids within the right MCA distribution. Skull and upper cervical spine: Craniocervical junction within normal limits. Degenerative spondylosis noted at C3-4 with resultant mild spinal stenosis. Bone marrow signal intensity within normal limits. No scalp soft tissue abnormality. Sinuses/Orbits: Patient status post bilateral ocular lens replacement. Scattered mucosal thickening noted throughout the paranasal sinuses, inflammatory/allergic in nature and likely chronic. No evidence for acute sinusitis. No mastoid effusion. Inner ear structures grossly normal. Other: None. IMPRESSION: 1. Patchy multifocal acute ischemic right MCA territory infarcts as above. No associated hemorrhage or mass effect. 2. Abnormal flow void within the right ICA to the cavernous segment, consistent with known chronic right ICA occlusion. Major intracranial vascular flow voids are otherwise maintained. 3. Underlying age-related cerebral atrophy with mild chronic small vessel ischemic disease. Electronically Signed   By: Jeannine Boga M.D.   On: 12/18/2019 04:30   IR CT Head Ltd  Result Date: 12/19/2019 INDICATION: 79 year old male with past medical history is significant for Parkinson's disease, hypertension, COPD with baseline 2 L of O2, DM2, CAD, presenting with sudden onset left facial droop and right gaze preference. At admission, he was found to have mild left hemiparesis, left-sided neglect, left facial droop and right gaze preference with ability to cross the midline; NIHSS 8. He was last known well at 8 a.m. on 12/17/2019. Head CT showed hypodensity in the right basal ganglia, insula and right temporal lobe with no hemorrhage. CT angiogram of the head and neck showed occlusion of the right ICA at the bifurcation as well as occlusion of the proximal right M1/MCA. No intravenous tPA given as patient was outside the window. Was taken to our service for emergency mechanical thrombectomy. EXAM:  Diagnostic cerebral angiogram and mechanical thrombectomy COMPARISON:  CT/CT angiogram of the head and neck December 17, 2019. MEDICATIONS: No antibiotics given. ANESTHESIA/SEDATION: The procedure was performed under the general anesthesia. FLUOROSCOPY TIME:  Fluoroscopy Time: 215 minutes 54 seconds (2,962 mGy). COMPLICATIONS: None immediate. TECHNIQUE: Informed written consent was obtained from the patient and patient's cousin after a thorough discussion of the procedural risks, benefits and alternatives. All questions were addressed. Maximal Sterile Barrier Technique was utilized including caps, mask, sterile gowns, sterile gloves, sterile drape, hand hygiene and skin antiseptic. A timeout was performed prior to the initiation of the procedure. The right groin was prepped and draped in the usual sterile fashion. Using a micropuncture kit and the modified Seldinger technique, access was gained to the right common femoral artery and an 8 French sheath was placed. Under fluoroscopy, an 8 [place] Walrus balloon guide catheter was navigated over a Berenstein 2 catheter and a 0.035 inch Terumo Glidewire into the aortic arch and then in the right common carotid artery. Frontal and lateral angiograms of the neck were obtained. FINDINGS: Occlusion of the right ICA at the proximal bulb with heavily calcified plaque. Contrast opacification of the intracranial right ICA at the of dominant segment via retrograde flow from the right ophthalmic artery. Opacification extends inferiorly to the cavernous segment. No right MCA opacification. PROCEDURE: The Berenstein 2 catheter was removed and a phenom 21 was navigated over a synchro 2 micro guidewire into the right common carotid artery. Multiple attempts to cross the cervical right ICA occlusion proved unsuccessful. The microcatheter was removed an coaxial navigation of the [place]  catheter was again performed into the right common carotid artery. Attempts to cross the occlusion  with a Terumo Glidewire also proved unsuccessful. After multiple attempts with different wires and catheter, distal access into the cervical right ICA was achieved through a false lumen. After multiple attempts, no access to the true lumen was gained. The catheter construct was subsequently withdrawn. A 5 [place] Berenstein 2 catheter was then navigated over a 0.035 inch Terumo Glidewire into the aortic arch. The catheter was placed into the left common carotid artery and then advanced into the left internal carotid artery. Frontal and lateral angiograms of the head were obtained brisk contrast opacification of the bilateral ACA and left MCA vascular tree was seen. Contrast opacification of the right ICA terminus was seen with occlusion of the proximal right M1/MCA moderate collateral flow from the right ACA to the right MCA vascular tree was seen. The catheter was then placed the left subclavian artery and advanced into the left vertebral artery. Frontal and lateral angiograms of the neck were obtained. No as brisk contrast opacification of the bilateral PCA vascular trees. The basilar artery appear normal. There is severe stenosis at the origin of the right posterior cerebral artery. Collateral flow to the right MCA vascular tree seen via leptomeningeal branches. The catheter was again placed into the right common carotid artery. Frontal and lateral angiograms of the neck were obtained. Persistent occlusion noted. At this point, discussion was held with Dr. Wilford Corner about transcirculation approach and risks involved, including embolus to new territory. Decision was made to proceed with thrombectomy. Berenstein 2 catheter was then removed. Under fluoroscopy, an 8 [place] Walrus balloon guide catheter was navigated over a Berenstein 2 catheter and a 0.035 inch Terumo Glidewire into the aortic arch and then in the left common carotid artery. Frontal and lateral road map of the head and neck were obtained. The Berenstein  catheter was removed and a Zoom 71 aspiration catheter was navigated over a phenom 21 microcatheter and a synchro 2 micro guidewire into the cavernous segment of the left ICA. Magnified frontal and lateral angiograms of the head were obtained and used as of roadmap. The microcatheter was then navigated into the right A1/ACA and then advanced through the anterior communicating artery into the right M2/MCA posterior division branch. Then, a 5 x 37 mm embotrap stent retriever was deployed spanning the right M1 and proximal M2/MCA. The device was allowed to intercalated with the clot for 4 minutes. The aspiration catheter was advanced into the left A1/ACA and connected to a penumbra aspiration pump. The guiding catheter balloon was inflated. The thrombectomy device and aspiration catheter were removed under constant aspiration. The guiding catheter balloon was deflated. Frontal and lateral angiograms of the were obtained. No opacification of the right anterior circulation seen, may be related to occlusion of the right A1 versus vasospasm. Embolus to the left A3/ACA noted. The Zoom 71 aspiration catheter was navigated over a phenom 21 microcatheter and a synchro 2 micro guidewire into the cavernous segment of the left ICA under biplane roadmap. The microcatheter was then navigated into the right A2/ACA. Frontal and lateral angiograms were obtained by microcatheter contrast injection. Patency of the right ACA was confirmed. Microcatheter was then into the left A3/ACA. Then, a 5 x 37 mm embotrap stent retriever was deployed spanning the A3 segment. The device was allowed to intercalated with the clot for 4 minutes. The aspiration catheter was advanced into the left A1/ACA and connected to a penumbra aspiration pump.  The guiding catheter balloon was inflated. The thrombectomy device and aspiration catheter were removed under constant aspiration. The guiding catheter balloon was deflated. Follow-up left ICA angiogram with  frontal and lateral views of the head were obtained showing recanalization of the left ACA with a nonocclusive thrombus in the A4 segment resulting in slow distal flow (TICI 2C). Flat panel CT of the head was obtained and post processed in a separate workstation with concurrent attending physician supervision. Selected images were sent to PACS. No evidence of hemorrhagic complication. The Zoom 71 aspiration catheter was navigated over a phenom 21 microcatheter and a synchro 2 micro guidewire into the cavernous segment of the left ICA under biplane roadmap. Magnified frontal and lateral angiograms of the head were obtained. Multiple attempts performed to gain access to the right A1/ACA, prompt unsuccessful. Follow-up left ICA angiograms with frontal and lateral views of the head showed brisk contrast opacification of the bilateral ACA and left MCA vascular tree with opacification of the right A1, M1 segment and distal right MCA branches, consistent with patency. Lack of contrast opacification on prior angiogram was likely related to vasospasm. Contrast density is poor, with washout noted likely from the posterior circulation via right posterior communicating artery. Distal penetration of right MCA anterior and posterior division branches is noted, likely TICI 2B-2C recanalization. The catheter was subsequently withdrawn. A 5 [place] Berenstein 2 catheter was then navigated over a 0.035 inch Terumo Glidewire into the aortic arch. The catheter was placed into the right subclavian artery and then advanced into the right vertebral artery. Frontal and lateral angiograms of the head were obtained. Normal opacification of the posterior circulation again seen with brisk opacification of the right ACA and MCA vascular tree seen by a right posterior communicating artery. Distal contrast penetration of MCA branches noted. The catheter was subsequently withdrawn. The right common femoral artery angiogram was performed via sheath  side port. Atherosclerotic changes of the right common iliac, external and femoral arteries noted with mild to moderate stenosis at the origin of the right superficial femoral artery. The femoral sheath was exchanged over the wire for an 8 French Angio-Seal closure device which was utilized for access closure. Immediate hemostasis was achieved. IMPRESSION: 1. Attempted revascularization of the chronically occluded right ICA bulb unsuccessful. 2. Mechanical thrombectomy of the right M1/MCA performed via trans circulation approach (left ICA-left A1/ACA-anterior communicating artery-right A1/ACA-right MCA). One pass performed with stent retriever with likely near complete recanalization (TICI2b). Evaluation limited due to contrast dilution from washout from right posterior communicating artery. 3. Embolus to left A3/ACA treated with 1 stent retriever pass with near complete recanalization (TICI 2C). 4. No hemorrhagic complication on postprocedural flat panel CT. PLAN: - Bed rest post femoral access 6h - ICU level of care- SBP 140-160- STAT head CT for any acute neurological deterioration Electronically Signed   By: Pedro Earls M.D.   On: 12/19/2019 15:40   CT C-SPINE NO CHARGE  Result Date: 12/17/2019 CLINICAL DATA:  Headache, neurologic deficit EXAM: CT CERVICAL SPINE WITHOUT CONTRAST TECHNIQUE: Multidetector CT imaging of the cervical spine was performed without intravenous contrast. Multiplanar CT image reconstructions were also generated. COMPARISON:  10/23/2016 x-ray, 09/04/2011 and 08/29/2019 chest CT FINDINGS: Alignment: No facet joint dislocation. Dens and lateral masses are aligned. No traumatic listhesis. Skull base and vertebrae: No acute fracture. No primary bone lesion or focal pathologic process. Soft tissues and spinal canal: No prevertebral fluid or swelling. No visible canal hematoma. Disc levels: Advanced multilevel disc  height loss with degenerative endplate spurring. Severe  multilevel left-sided facet arthropathy with suggestion of severe left-sided foraminal stenosis at C3-4, C4-5, and C5-6. Upper chest: Centrilobular and paraseptal emphysema within the lung apices. There are a few small 2-3 mm right upper lobe pulmonary nodules, stable from 09/04/2011. Mildly enlarged AP window lymph node, stable from 08/29/2019. Other: Aortic and carotid atherosclerotic calcification. IMPRESSION: 1. No acute fracture or traumatic listhesis of the cervical spine. 2. 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 3. Emphysema and aortic atherosclerosis. Aortic Atherosclerosis (ICD10-I70.0) and Emphysema (ICD10-J43.9). Electronically Signed   By: Davina Poke D.O.   On: 12/17/2019 15:18   CT Code Stroke Cerebral Perfusion with contrast  Result Date: 12/17/2019 CLINICAL DATA:  Left-sided facial droop.  Abnormal CT scan. EXAM: CT ANGIOGRAPHY HEAD AND NECK CT PERFUSION BRAIN TECHNIQUE: Multidetector CT imaging of the head and neck was performed using the standard protocol during bolus administration of intravenous contrast. Multiplanar CT image reconstructions and MIPs were obtained to evaluate the vascular anatomy. Carotid stenosis measurements (when applicable) are obtained utilizing NASCET criteria, using the distal internal carotid diameter as the denominator. Multiphase CT imaging of the brain was performed following IV bolus contrast injection. Subsequent parametric perfusion maps were calculated using RAPID software. CONTRAST:  173m OMNIPAQUE IOHEXOL 350 MG/ML SOLN COMPARISON:  CT head without contrast 12/17/2019 FINDINGS: CTA NECK FINDINGS Aortic arch: A 3 vessel arch configuration is present. Atherosclerotic changes are present in the arch and at the origins of the innominate and subclavian arteries without significant stenosis or aneurysm. Right carotid system: Minimal wall  calcifications are present within the right common carotid artery. Dense calcifications are present bifurcation. The right ICA is occluded without reconstitution in the neck. Left carotid system: Left carotid artery demonstrates scattered mural calcifications. Lumen is narrowed to 3.5 mm. No significant stenosis of greater than 50% is present. Moderate tortuosity is present within the cervical left ICA without other stenosis. Vertebral arteries: The vertebral arteries originate from the subclavian arteries bilaterally. The right vertebral artery is the dominant vessel. Mild proximal stenosis of less than 50% are present bilaterally. No additional stenoses are present in either vertebral artery in the neck. Skeleton: Multilevel degenerative changes are present. No focal lytic or blastic lesions are present. Patient is edentulous. Other neck: Soft tissues the neck are otherwise unremarkable. Salivary glands are within normal limits. No significant adenopathy is present. Thyroid is normal. Upper chest: Centrilobular emphysematous changes are present. Focal nodule or mass lesion is present. Thoracic inlet is normal. Review of the MIP images confirms the above findings CTA HEAD FINDINGS Anterior circulation: Minimal contrast is present in the cavernous right internal carotid artery from the ophthalmic segment to the posterior communicating artery. There is severe narrowing of the terminal right ICA. Atherosclerotic changes are present the cavernous left ICA without focal stenosis through the ICA terminus. The left A1 and M1 segments are normal. The anterior communicating artery is patent. Contrast is present in the right A1 segment. Right M1 segment is occluded. Minimal collaterals are present. Left MCA branch vessels and bilateral ACA branch vessels are within normal limits. Posterior circulation: The right vertebral artery is slightly dominant. PICA origins are visualized and normal. Vertebrobasilar junction is normal.  Basilar artery is normal. Left posterior cerebral artery originates from basilar tip. The right posterior cerebral artery is fed by a small right posterior communicating artery and small right P1 segment. PCA  branch vessels are intact bilaterally. Venous sinuses: The dural sinuses are patent. The straight sinus and deep cerebral veins are intact. Cortical veins are unremarkable. No vascular malformation is present. Anatomic variants: Fetal type right posterior cerebral artery remains patent with a small right P1 segment Review of the MIP images confirms the above findings CT Brain Perfusion Findings: ASPECTS: 6/10 CBF (<30%) Volume: 29m Perfusion (Tmax>6.0s) volume: 19106mMismatch Volume: 15740mnfarction Location:Right MCA territory IMPRESSION: 1. 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 2. Proximal right M1 large vessel occlusion with minimal collaterals. 3. 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 4. Short-segment reconstitution of the right ICA in the cavernous segment continues to severe stenosis of the terminal right internal carotid artery. 5. The right posterior cerebral artery is fed by a small right posterior communicating artery and small right P1 segment. 6. Aortic Atherosclerosis (ICD10-I70.0) and Emphysema (ICD10-J43.9). 7. Multilevel degenerative changes in the cervical spine. These results were called by telephone at the time of interpretation on 12/17/2019 at 2:35 pm to provider AroRory Percyho verbally acknowledged these results. Electronically Signed   By: ChrSan MorelleD.   On: 12/17/2019 15:10   DG CHEST PORT 1 VIEW  Result Date: 12/17/2019 CLINICAL DATA:  Intubation. EXAM: PORTABLE CHEST 1 VIEW COMPARISON:  Radiograph 12/05/2019, chest CT 08/29/2019 FINDINGS: Endotracheal tube tip is 5.9 cm from the carina. Tip and side port of the enteric tube below the diaphragm in the stomach, the tube may be kinked at the side port. Cardiomegaly with slight  increase from prior exam. Chronic interstitial coarsening. Chronic right basilar pleural calcifications and blunting of the costophrenic angle. No new airspace disease. No pneumothorax. IMPRESSION: 1. Endotracheal tube tip 5.9 cm from the carina. 2. Enteric tube tip and side port below the diaphragm in the stomach, the tube may be kinked at the side port. 3. Slight increase in cardiomegaly from prior. Chronic interstitial coarsening. Electronically Signed   By: MelKeith RakeD.   On: 12/17/2019 20:58   IR PERCUTANEOUS ART THROMBECTOMY/INFUSION INTRACRANIAL INC DIAG ANGIO  Result Date: 12/19/2019 INDICATION: [age]ar old male with past medical history is significant for Parkinson's disease, hypertension, COPD with baseline 2 L of O2, DM2, CAD, presenting with sudden onset left facial droop and right gaze preference. At admission, he was found to have mild left hemiparesis, left-sided neglect, left facial droop and right gaze preference with ability to cross the midline; NIHSS 8. He was last known well at 8 a.m. on 12/17/2019. Head CT showed hypodensity in the right basal ganglia, insula and right temporal lobe with no hemorrhage. CT angiogram of the head and neck showed occlusion of the right ICA at the bifurcation as well as occlusion of the proximal right M1/MCA. No intravenous tPA given as patient was outside the window. Was taken to our service for emergency mechanical thrombectomy. EXAM: Diagnostic cerebral angiogram and mechanical thrombectomy COMPARISON:  CT/CT angiogram of the head and neck December 17, 2019. MEDICATIONS: No antibiotics given. ANESTHESIA/SEDATION: The procedure was performed under the general anesthesia. FLUOROSCOPY TIME:  Fluoroscopy Time: 215 minutes 54 seconds (2,962 mGy). COMPLICATIONS: None immediate. TECHNIQUE: Informed written consent was obtained from the patient and patient's cousin after a thorough discussion of the procedural risks, benefits and alternatives. All questions were  addressed. Maximal Sterile Barrier Technique was utilized including caps, mask, sterile gowns, sterile gloves, sterile drape, hand hygiene and skin antiseptic. A timeout was performed prior to the initiation of the procedure. The right  groin was prepped and draped in the usual sterile fashion. Using a micropuncture kit and the modified Seldinger technique, access was gained to the right common femoral artery and an 8 French sheath was placed. Under fluoroscopy, an 8 [place] Walrus balloon guide catheter was navigated over a Berenstein 2 catheter and a 0.035 inch Terumo Glidewire into the aortic arch and then in the right common carotid artery. Frontal and lateral angiograms of the neck were obtained. FINDINGS: Occlusion of the right ICA at the proximal bulb with heavily calcified plaque. Contrast opacification of the intracranial right ICA at the of dominant segment via retrograde flow from the right ophthalmic artery. Opacification extends inferiorly to the cavernous segment. No right MCA opacification. PROCEDURE: The Berenstein 2 catheter was removed and a phenom 21 was navigated over a synchro 2 micro guidewire into the right common carotid artery. Multiple attempts to cross the cervical right ICA occlusion proved unsuccessful. The microcatheter was removed an coaxial navigation of the Berenstein catheter was again performed into the right common carotid artery. Attempts to cross the occlusion with a Terumo Glidewire also proved unsuccessful. After multiple attempts with different wires and catheter, distal access into the cervical right ICA was achieved through a false lumen. After multiple attempts, no access to the true lumen was gained. The catheter construct was subsequently withdrawn. A 5 [place] Berenstein 2 catheter was then navigated over a 0.035 inch Terumo Glidewire into the aortic arch. The catheter was placed into the left common carotid artery and then advanced into the left internal carotid artery.  Frontal and lateral angiograms of the head were obtained brisk contrast opacification of the bilateral ACA and left MCA vascular tree was seen. Contrast opacification of the right ICA terminus was seen with occlusion of the proximal right M1/MCA moderate collateral flow from the right ACA to the right MCA vascular tree was seen. The catheter was then placed the left subclavian artery and advanced into the left vertebral artery. Frontal and lateral angiograms of the neck were obtained. No as brisk contrast opacification of the bilateral PCA vascular trees. The basilar artery appear normal. There is severe stenosis at the origin of the right posterior cerebral artery. Collateral flow to the right MCA vascular tree seen via leptomeningeal branches. The catheter was again placed into the right common carotid artery. Frontal and lateral angiograms of the neck were obtained. Persistent occlusion noted. At this point, discussion was held with Dr. Rory Percy about transcirculation approach and risks involved, including embolus to new territory. Decision was made to proceed with thrombectomy. Berenstein 2 catheter was then removed. Under fluoroscopy, an 8 [place] Walrus balloon guide catheter was navigated over a Berenstein 2 catheter and a 0.035 inch Terumo Glidewire into the aortic arch and then in the left common carotid artery. Frontal and lateral road map of the head and neck were obtained. The Berenstein catheter was removed and a Zoom 71 aspiration catheter was navigated over a phenom 21 microcatheter and a synchro 2 micro guidewire into the cavernous segment of the left ICA. Magnified frontal and lateral angiograms of the head were obtained and used as of roadmap. The microcatheter was then navigated into the right A1/ACA and then advanced through the anterior communicating artery into the right M2/MCA posterior division branch. Then, a 5 x 37 mm embotrap stent retriever was deployed spanning the right M1 and proximal  M2/MCA. The device was allowed to intercalated with the clot for 4 minutes. The aspiration catheter was advanced into the left  A1/ACA and connected to a penumbra aspiration pump. The guiding catheter balloon was inflated. The thrombectomy device and aspiration catheter were removed under constant aspiration. The guiding catheter balloon was deflated. Frontal and lateral angiograms of the were obtained. No opacification of the right anterior circulation seen, may be related to occlusion of the right A1 versus vasospasm. Embolus to the left A3/ACA noted. The Zoom 71 aspiration catheter was navigated over a phenom 21 microcatheter and a synchro 2 micro guidewire into the cavernous segment of the left ICA under biplane roadmap. The microcatheter was then navigated into the right A2/ACA. Frontal and lateral angiograms were obtained by microcatheter contrast injection. Patency of the right ACA was confirmed. Microcatheter was then into the left A3/ACA. Then, a 5 x 37 mm embotrap stent retriever was deployed spanning the A3 segment. The device was allowed to intercalated with the clot for 4 minutes. The aspiration catheter was advanced into the left A1/ACA and connected to a penumbra aspiration pump. The guiding catheter balloon was inflated. The thrombectomy device and aspiration catheter were removed under constant aspiration. The guiding catheter balloon was deflated. Follow-up left ICA angiogram with frontal and lateral views of the head were obtained showing recanalization of the left ACA with a nonocclusive thrombus in the A4 segment resulting in slow distal flow (TICI 2C). Flat panel CT of the head was obtained and post processed in a separate workstation with concurrent attending physician supervision. Selected images were sent to PACS. No evidence of hemorrhagic complication. The Zoom 71 aspiration catheter was navigated over a phenom 21 microcatheter and a synchro 2 micro guidewire into the cavernous segment of  the left ICA under biplane roadmap. Magnified frontal and lateral angiograms of the head were obtained. Multiple attempts performed to gain access to the right A1/ACA, prompt unsuccessful. Follow-up left ICA angiograms with frontal and lateral views of the head showed brisk contrast opacification of the bilateral ACA and left MCA vascular tree with opacification of the right A1, M1 segment and distal right MCA branches, consistent with patency. Lack of contrast opacification on prior angiogram was likely related to vasospasm. Contrast density is poor, with washout noted likely from the posterior circulation via right posterior communicating artery. Distal penetration of right MCA anterior and posterior division branches is noted, likely TICI 2B-2C recanalization. The catheter was subsequently withdrawn. A 5 [place] Berenstein 2 catheter was then navigated over a 0.035 inch Terumo Glidewire into the aortic arch. The catheter was placed into the right subclavian artery and then advanced into the right vertebral artery. Frontal and lateral angiograms of the head were obtained. Normal opacification of the posterior circulation again seen with brisk opacification of the right ACA and MCA vascular tree seen by a right posterior communicating artery. Distal contrast penetration of MCA branches noted. The catheter was subsequently withdrawn. The right common femoral artery angiogram was performed via sheath side port. Atherosclerotic changes of the right common iliac, external and femoral arteries noted with mild to moderate stenosis at the origin of the right superficial femoral artery. The femoral sheath was exchanged over the wire for an 8 French Angio-Seal closure device which was utilized for access closure. Immediate hemostasis was achieved. IMPRESSION: 1. Attempted revascularization of the chronically occluded right ICA bulb unsuccessful. 2. Mechanical thrombectomy of the right M1/MCA performed via trans circulation  approach (left ICA-left A1/ACA-anterior communicating artery-right A1/ACA-right MCA). One pass performed with stent retriever with likely near complete recanalization (TICI2b). Evaluation limited due to contrast dilution from washout from  right posterior communicating artery. 3. Embolus to left A3/ACA treated with 1 stent retriever pass with near complete recanalization (TICI 2C). 4. No hemorrhagic complication on postprocedural flat panel CT. PLAN: - Bed rest post femoral access 6h - ICU level of care- SBP 140-160- STAT head CT for any acute neurological deterioration Electronically Signed   By: Pedro Earls M.D.   On: 12/19/2019 15:40   CT HEAD CODE STROKE WO CONTRAST  Result Date: 12/17/2019 CLINICAL DATA:  Code stroke. Left facial droop. Right-sided gaze. Last seen normal at 9 a.m. EXAM: CT HEAD WITHOUT CONTRAST TECHNIQUE: Contiguous axial images were obtained from the base of the skull through the vertex without intravenous contrast. COMPARISON:  CT head without contrast 12/05/2019 [facility] FINDINGS: Brain: Acute nonhemorrhagic infarct is present in the right lentiform nucleus, crossing the internal capsule and involving the right caudate head. Infarct involves the posterior left scratched at the infarct involves posterior right lentiform nucleus and portions of the right temporal lobe. Anterior insular cortex is intact. No additional cortical abnormalities are present over the convexity. Left basal ganglia is within normal limits. Moderate white matter changes are present bilaterally. No acute hemorrhage or mass lesion is present. The ventricles are of normal size. The brainstem and cerebellum are within normal limits. No significant extraaxial fluid collection is present. Vascular: Atherosclerotic calcifications are present within the cavernous internal carotid arteries bilaterally. No hyperdense vessel is present. Skull: Calvarium is intact. No focal lytic or blastic lesions  are present. No significant extracranial soft tissue lesion is present. Sinuses/Orbits: Benign chondroid lesion or ostium of the right frontal sinus is stable. The paranasal sinuses and mastoid air cells are otherwise clear. Bilateral lens replacements are noted. Globes and orbits are otherwise unremarkable. ASPECTS [facility] Stroke Program Early CT Score) - Ganglionic level infarction (caudate, lentiform nuclei, internal capsule, insula, M1-M3 cortex): 3/7 - Supraganglionic infarction (M4-M6 cortex): 3/3 Total score (0-10 with 10 being normal): 6/10 IMPRESSION: 1. Acute nonhemorrhagic infarct involving the right lentiform nucleus, crossing the internal capsule and portions of the right caudate head. 2. Acute nonhemorrhagic infarct involving the posterior right insular ribbon. Infarct may involve portions of the temporal lobe. 3. ASPECTS is 6/10. 4. Stable moderate white matter disease. The above was relayed via text pager to Dr. Rory Percy on 12/17/2019 at 14:06 . Electronically Signed   By: San Morelle M.D.   On: 12/17/2019 14:08   ECHOCARDIOGRAM LIMITED  Result Date: 12/18/2019    ECHOCARDIOGRAM LIMITED REPORT   Patient Name:   MEGHAN TIEMANN Date of Exam: 12/18/2019 Medical Rec #:  [id number]    Height:       72.0 in Accession #:    [id number]   Weight:       193.6 lb Date of Birth:  Feb 05, 1941    BSA:          2.101 m Patient Age:    37 years     BP:           122/62 mmHg Patient Gender: M            HR:           53 bpm. Exam Location:  Inpatient Procedure: Limited Color Doppler, Cardiac Doppler and Limited Echo Indications:    stroke 434.91  History:        Patient has prior history of Echocardiogram examinations, most                 recent 09/30/2019. CAD,  COPD and Parkinsons; Risk                 Factors:Hypertension, Dyslipidemia and Sleep Apnea.  Sonographer:    Johny Chess Referring Phys: [id number] ASHISH ARORA IMPRESSIONS  1. Left ventricular ejection fraction, by estimation, is 60 to 65%. The left  ventricle has normal function. The left ventricle has no regional wall motion abnormalities. There is mild concentric left ventricular hypertrophy. Left ventricular diastolic parameters are consistent with Grade I diastolic dysfunction (impaired relaxation).  2. Right ventricular systolic function is normal. The right ventricular size is normal.  3. The mitral valve is normal in structure. Mild mitral valve regurgitation. No evidence of mitral stenosis.  4. The aortic valve is normal in structure. Aortic valve regurgitation is not visualized. No aortic stenosis is present.  5. The inferior vena cava is dilated in size with <50% respiratory variability, suggesting right atrial pressure of 15 mmHg. Conclusion(s)/Recommendation(s): No intracardiac source of embolism detected on this transthoracic study. A transesophageal echocardiogram is recommended to exclude cardiac source of embolism if clinically indicated. FINDINGS  Left Ventricle: Left ventricular ejection fraction, by estimation, is 60 to 65%. The left ventricle has normal function. The left ventricle has no regional wall motion abnormalities. The left ventricular internal cavity size was normal in size. There is  mild concentric left ventricular hypertrophy. Normal left ventricular filling pressure. Right Ventricle: The right ventricular size is normal. No increase in right ventricular wall thickness. Right ventricular systolic function is normal. Left Atrium: Left atrial size was normal in size. Right Atrium: Right atrial size was normal in size. Pericardium: There is no evidence of pericardial effusion. Mitral Valve: The mitral valve is normal in structure. There is moderate thickening of the mitral valve leaflet(s). Normal mobility of the mitral valve leaflets. Moderate mitral annular calcification. Mild mitral valve regurgitation, with centrally-directed jet. No evidence of mitral valve stenosis. Tricuspid Valve: The tricuspid valve is normal in structure.  Tricuspid valve regurgitation is trivial. No evidence of tricuspid stenosis. Aortic Valve: The aortic valve is normal in structure. Aortic valve regurgitation is not visualized. No aortic stenosis is present. Pulmonic Valve: The pulmonic valve was normal in structure. Pulmonic valve regurgitation is not visualized. No evidence of pulmonic stenosis. Aorta: The aortic root is normal in size and structure. Venous: The inferior vena cava is dilated in size with less than 50% respiratory variability, suggesting right atrial pressure of 15 mmHg. IAS/Shunts: No atrial level shunt detected by color flow Doppler.  LEFT VENTRICLE PLAX 2D LVIDd:         4.40 cm  Diastology LVIDs:         2.70 cm  LV e' lateral:   9.14 cm/s LV PW:         1.10 cm  LV E/e' lateral: 10.9 LV IVS:        0.90 cm  LV e' medial:    8.05 cm/s LVOT diam:     1.90 cm  LV E/e' medial:  12.3 LV SV:         93 LV SV Index:   44 LVOT Area:     2.84 cm  IVC IVC diam: 2.50 cm LEFT ATRIUM         Index LA diam:    3.10 cm 1.48 cm/m  AORTIC VALVE LVOT Vmax:   123.00 cm/s LVOT Vmean:  81.200 cm/s LVOT VTI:    0.328 m  AORTA Ao Root diam: 3.40 cm MITRAL VALVE MV Area (PHT): 3.27  cm    SHUNTS MV Decel Time: 232 msec    Systemic VTI:  0.33 m MV E velocity: 99.40 cm/s  Systemic Diam: 1.90 cm MV A velocity: 90.40 cm/s MV E/A ratio:  1.10 Ena Dawley MD Electronically signed by Ena Dawley MD Signature Date/Time: 12/18/2019/4:32:25 PM    Final    IR ANGIO INTRA EXTRACRAN SEL INTERNAL CAROTID UNI R MOD SED  Result Date: 12/19/2019 INDICATION: 79 year old male with past medical history is significant for Parkinson's disease, hypertension, COPD with baseline 2 L of O2, DM2, CAD, presenting with sudden onset left facial droop and right gaze preference. At admission, he was found to have mild left hemiparesis, left-sided neglect, left facial droop and right gaze preference with ability to cross the midline; NIHSS 8. He was last known well at 8 a.m. on  12/17/2019. Head CT showed hypodensity in the right basal ganglia, insula and right temporal lobe with no hemorrhage. CT angiogram of the head and neck showed occlusion of the right ICA at the bifurcation as well as occlusion of the proximal right M1/MCA. No intravenous tPA given as patient was outside the window. Was taken to our service for emergency mechanical thrombectomy. EXAM: Diagnostic cerebral angiogram and mechanical thrombectomy COMPARISON:  CT/CT angiogram of the head and neck December 17, 2019. MEDICATIONS: No antibiotics given. ANESTHESIA/SEDATION: The procedure was performed under the general anesthesia. FLUOROSCOPY TIME:  Fluoroscopy Time: 215 minutes 54 seconds (2,962 mGy). COMPLICATIONS: None immediate. TECHNIQUE: Informed written consent was obtained from the patient and patient's cousin after a thorough discussion of the procedural risks, benefits and alternatives. All questions were addressed. Maximal Sterile Barrier Technique was utilized including caps, mask, sterile gowns, sterile gloves, sterile drape, hand hygiene and skin antiseptic. A timeout was performed prior to the initiation of the procedure. The right groin was prepped and draped in the usual sterile fashion. Using a micropuncture kit and the modified Seldinger technique, access was gained to the right common femoral artery and an 8 French sheath was placed. Under fluoroscopy, an 8 [place] Walrus balloon guide catheter was navigated over a Berenstein 2 catheter and a 0.035 inch Terumo Glidewire into the aortic arch and then in the right common carotid artery. Frontal and lateral angiograms of the neck were obtained. FINDINGS: Occlusion of the right ICA at the proximal bulb with heavily calcified plaque. Contrast opacification of the intracranial right ICA at the of dominant segment via retrograde flow from the right ophthalmic artery. Opacification extends inferiorly to the cavernous segment. No right MCA opacification. PROCEDURE: The  Berenstein 2 catheter was removed and a phenom 21 was navigated over a synchro 2 micro guidewire into the right common carotid artery. Multiple attempts to cross the cervical right ICA occlusion proved unsuccessful. The microcatheter was removed an coaxial navigation of the Berenstein catheter was again performed into the right common carotid artery. Attempts to cross the occlusion with a Terumo Glidewire also proved unsuccessful. After multiple attempts with different wires and catheter, distal access into the cervical right ICA was achieved through a false lumen. After multiple attempts, no access to the true lumen was gained. The catheter construct was subsequently withdrawn. A 5 [place] Berenstein 2 catheter was then navigated over a 0.035 inch Terumo Glidewire into the aortic arch. The catheter was placed into the left common carotid artery and then advanced into the left internal carotid artery. Frontal and lateral angiograms of the head were obtained brisk contrast opacification of the bilateral ACA and left MCA vascular tree  was seen. Contrast opacification of the right ICA terminus was seen with occlusion of the proximal right M1/MCA moderate collateral flow from the right ACA to the right MCA vascular tree was seen. The catheter was then placed the left subclavian artery and advanced into the left vertebral artery. Frontal and lateral angiograms of the neck were obtained. No as brisk contrast opacification of the bilateral PCA vascular trees. The basilar artery appear normal. There is severe stenosis at the origin of the right posterior cerebral artery. Collateral flow to the right MCA vascular tree seen via leptomeningeal branches. The catheter was again placed into the right common carotid artery. Frontal and lateral angiograms of the neck were obtained. Persistent occlusion noted. At this point, discussion was held with Dr. Rory Percy about transcirculation approach and risks involved, including embolus to  new territory. Decision was made to proceed with thrombectomy. Berenstein 2 catheter was then removed. Under fluoroscopy, an 8 [place] Walrus balloon guide catheter was navigated over a Berenstein 2 catheter and a 0.035 inch Terumo Glidewire into the aortic arch and then in the left common carotid artery. Frontal and lateral road map of the head and neck were obtained. The Berenstein catheter was removed and a Zoom 71 aspiration catheter was navigated over a phenom 21 microcatheter and a synchro 2 micro guidewire into the cavernous segment of the left ICA. Magnified frontal and lateral angiograms of the head were obtained and used as of roadmap. The microcatheter was then navigated into the right A1/ACA and then advanced through the anterior communicating artery into the right M2/MCA posterior division branch. Then, a 5 x 37 mm embotrap stent retriever was deployed spanning the right M1 and proximal M2/MCA. The device was allowed to intercalated with the clot for 4 minutes. The aspiration catheter was advanced into the left A1/ACA and connected to a penumbra aspiration pump. The guiding catheter balloon was inflated. The thrombectomy device and aspiration catheter were removed under constant aspiration. The guiding catheter balloon was deflated. Frontal and lateral angiograms of the were obtained. No opacification of the right anterior circulation seen, may be related to occlusion of the right A1 versus vasospasm. Embolus to the left A3/ACA noted. The Zoom 71 aspiration catheter was navigated over a phenom 21 microcatheter and a synchro 2 micro guidewire into the cavernous segment of the left ICA under biplane roadmap. The microcatheter was then navigated into the right A2/ACA. Frontal and lateral angiograms were obtained by microcatheter contrast injection. Patency of the right ACA was confirmed. Microcatheter was then into the left A3/ACA. Then, a 5 x 37 mm embotrap stent retriever was deployed spanning the A3  segment. The device was allowed to intercalated with the clot for 4 minutes. The aspiration catheter was advanced into the left A1/ACA and connected to a penumbra aspiration pump. The guiding catheter balloon was inflated. The thrombectomy device and aspiration catheter were removed under constant aspiration. The guiding catheter balloon was deflated. Follow-up left ICA angiogram with frontal and lateral views of the head were obtained showing recanalization of the left ACA with a nonocclusive thrombus in the A4 segment resulting in slow distal flow (TICI 2C). Flat panel CT of the head was obtained and post processed in a separate workstation with concurrent attending physician supervision. Selected images were sent to PACS. No evidence of hemorrhagic complication. The Zoom 71 aspiration catheter was navigated over a phenom 21 microcatheter and a synchro 2 micro guidewire into the cavernous segment of the left ICA under biplane roadmap. Magnified  frontal and lateral angiograms of the head were obtained. Multiple attempts performed to gain access to the right A1/ACA, prompt unsuccessful. Follow-up left ICA angiograms with frontal and lateral views of the head showed brisk contrast opacification of the bilateral ACA and left MCA vascular tree with opacification of the right A1, M1 segment and distal right MCA branches, consistent with patency. Lack of contrast opacification on prior angiogram was likely related to vasospasm. Contrast density is poor, with washout noted likely from the posterior circulation via right posterior communicating artery. Distal penetration of right MCA anterior and posterior division branches is noted, likely TICI 2B-2C recanalization. The catheter was subsequently withdrawn. A 5 [place] Berenstein 2 catheter was then navigated over a 0.035 inch Terumo Glidewire into the aortic arch. The catheter was placed into the right subclavian artery and then advanced into the right vertebral artery.  Frontal and lateral angiograms of the head were obtained. Normal opacification of the posterior circulation again seen with brisk opacification of the right ACA and MCA vascular tree seen by a right posterior communicating artery. Distal contrast penetration of MCA branches noted. The catheter was subsequently withdrawn. The right common femoral artery angiogram was performed via sheath side port. Atherosclerotic changes of the right common iliac, external and femoral arteries noted with mild to moderate stenosis at the origin of the right superficial femoral artery. The femoral sheath was exchanged over the wire for an 8 French Angio-Seal closure device which was utilized for access closure. Immediate hemostasis was achieved. IMPRESSION: 1. Attempted revascularization of the chronically occluded right ICA bulb unsuccessful. 2. Mechanical thrombectomy of the right M1/MCA performed via trans circulation approach (left ICA-left A1/ACA-anterior communicating artery-right A1/ACA-right MCA). One pass performed with stent retriever with likely near complete recanalization (TICI2b). Evaluation limited due to contrast dilution from washout from right posterior communicating artery. 3. Embolus to left A3/ACA treated with 1 stent retriever pass with near complete recanalization (TICI 2C). 4. No hemorrhagic complication on postprocedural flat panel CT. PLAN: - Bed rest post femoral access 6h - ICU level of care- SBP 140-160- STAT head CT for any acute neurological deterioration Electronically Signed   By: Pedro Earls M.D.   On: 12/19/2019 15:40   IR ANGIO VERTEBRAL SEL SUBCLAVIAN INNOMINATE BILAT MOD SED  Result Date: 12/19/2019 INDICATION: 79 year old male with past medical history is significant for Parkinson's disease, hypertension, COPD with baseline 2 L of O2, DM2, CAD, presenting with sudden onset left facial droop and right gaze preference. At admission, he was found to have mild left hemiparesis,  left-sided neglect, left facial droop and right gaze preference with ability to cross the midline; NIHSS 8. He was last known well at 8 a.m. on 12/17/2019. Head CT showed hypodensity in the right basal ganglia, insula and right temporal lobe with no hemorrhage. CT angiogram of the head and neck showed occlusion of the right ICA at the bifurcation as well as occlusion of the proximal right M1/MCA. No intravenous tPA given as patient was outside the window. Was taken to our service for emergency mechanical thrombectomy. EXAM: Diagnostic cerebral angiogram and mechanical thrombectomy COMPARISON:  CT/CT angiogram of the head and neck December 17, 2019. MEDICATIONS: No antibiotics given. ANESTHESIA/SEDATION: The procedure was performed under the general anesthesia. FLUOROSCOPY TIME:  Fluoroscopy Time: 215 minutes 54 seconds (2,962 mGy). COMPLICATIONS: None immediate. TECHNIQUE: Informed written consent was obtained from the patient and patient's cousin after a thorough discussion of the procedural risks, benefits and alternatives. All questions were addressed.  Maximal Sterile Barrier Technique was utilized including caps, mask, sterile gowns, sterile gloves, sterile drape, hand hygiene and skin antiseptic. A timeout was performed prior to the initiation of the procedure. The right groin was prepped and draped in the usual sterile fashion. Using a micropuncture kit and the modified Seldinger technique, access was gained to the right common femoral artery and an 8 French sheath was placed. Under fluoroscopy, an 8 [place] Walrus balloon guide catheter was navigated over a Berenstein 2 catheter and a 0.035 inch Terumo Glidewire into the aortic arch and then in the right common carotid artery. Frontal and lateral angiograms of the neck were obtained. FINDINGS: Occlusion of the right ICA at the proximal bulb with heavily calcified plaque. Contrast opacification of the intracranial right ICA at the of dominant segment via retrograde  flow from the right ophthalmic artery. Opacification extends inferiorly to the cavernous segment. No right MCA opacification. PROCEDURE: The Berenstein 2 catheter was removed and a phenom 21 was navigated over a synchro 2 micro guidewire into the right common carotid artery. Multiple attempts to cross the cervical right ICA occlusion proved unsuccessful. The microcatheter was removed an coaxial navigation of the Berenstein catheter was again performed into the right common carotid artery. Attempts to cross the occlusion with a Terumo Glidewire also proved unsuccessful. After multiple attempts with different wires and catheter, distal access into the cervical right ICA was achieved through a false lumen. After multiple attempts, no access to the true lumen was gained. The catheter construct was subsequently withdrawn. A 5 [place] Berenstein 2 catheter was then navigated over a 0.035 inch Terumo Glidewire into the aortic arch. The catheter was placed into the left common carotid artery and then advanced into the left internal carotid artery. Frontal and lateral angiograms of the head were obtained brisk contrast opacification of the bilateral ACA and left MCA vascular tree was seen. Contrast opacification of the right ICA terminus was seen with occlusion of the proximal right M1/MCA moderate collateral flow from the right ACA to the right MCA vascular tree was seen. The catheter was then placed the left subclavian artery and advanced into the left vertebral artery. Frontal and lateral angiograms of the neck were obtained. No as brisk contrast opacification of the bilateral PCA vascular trees. The basilar artery appear normal. There is severe stenosis at the origin of the right posterior cerebral artery. Collateral flow to the right MCA vascular tree seen via leptomeningeal branches. The catheter was again placed into the right common carotid artery. Frontal and lateral angiograms of the neck were obtained. Persistent  occlusion noted. At this point, discussion was held with Dr. Rory Percy about transcirculation approach and risks involved, including embolus to new territory. Decision was made to proceed with thrombectomy. Berenstein 2 catheter was then removed. Under fluoroscopy, an 8 [place] Walrus balloon guide catheter was navigated over a Berenstein 2 catheter and a 0.035 inch Terumo Glidewire into the aortic arch and then in the left common carotid artery. Frontal and lateral road map of the head and neck were obtained. The Berenstein catheter was removed and a Zoom 71 aspiration catheter was navigated over a phenom 21 microcatheter and a synchro 2 micro guidewire into the cavernous segment of the left ICA. Magnified frontal and lateral angiograms of the head were obtained and used as of roadmap. The microcatheter was then navigated into the right A1/ACA and then advanced through the anterior communicating artery into the right M2/MCA posterior division branch. Then, a 5 x 37  mm embotrap stent retriever was deployed spanning the right M1 and proximal M2/MCA. The device was allowed to intercalated with the clot for 4 minutes. The aspiration catheter was advanced into the left A1/ACA and connected to a penumbra aspiration pump. The guiding catheter balloon was inflated. The thrombectomy device and aspiration catheter were removed under constant aspiration. The guiding catheter balloon was deflated. Frontal and lateral angiograms of the were obtained. No opacification of the right anterior circulation seen, may be related to occlusion of the right A1 versus vasospasm. Embolus to the left A3/ACA noted. The Zoom 71 aspiration catheter was navigated over a phenom 21 microcatheter and a synchro 2 micro guidewire into the cavernous segment of the left ICA under biplane roadmap. The microcatheter was then navigated into the right A2/ACA. Frontal and lateral angiograms were obtained by microcatheter contrast injection. Patency of the right  ACA was confirmed. Microcatheter was then into the left A3/ACA. Then, a 5 x 37 mm embotrap stent retriever was deployed spanning the A3 segment. The device was allowed to intercalated with the clot for 4 minutes. The aspiration catheter was advanced into the left A1/ACA and connected to a penumbra aspiration pump. The guiding catheter balloon was inflated. The thrombectomy device and aspiration catheter were removed under constant aspiration. The guiding catheter balloon was deflated. Follow-up left ICA angiogram with frontal and lateral views of the head were obtained showing recanalization of the left ACA with a nonocclusive thrombus in the A4 segment resulting in slow distal flow (TICI 2C). Flat panel CT of the head was obtained and post processed in a separate workstation with concurrent attending physician supervision. Selected images were sent to PACS. No evidence of hemorrhagic complication. The Zoom 71 aspiration catheter was navigated over a phenom 21 microcatheter and a synchro 2 micro guidewire into the cavernous segment of the left ICA under biplane roadmap. Magnified frontal and lateral angiograms of the head were obtained. Multiple attempts performed to gain access to the right A1/ACA, prompt unsuccessful. Follow-up left ICA angiograms with frontal and lateral views of the head showed brisk contrast opacification of the bilateral ACA and left MCA vascular tree with opacification of the right A1, M1 segment and distal right MCA branches, consistent with patency. Lack of contrast opacification on prior angiogram was likely related to vasospasm. Contrast density is poor, with washout noted likely from the posterior circulation via right posterior communicating artery. Distal penetration of right MCA anterior and posterior division branches is noted, likely TICI 2B-2C recanalization. The catheter was subsequently withdrawn. A 5 [place] Berenstein 2 catheter was then navigated over a 0.035 inch Terumo  Glidewire into the aortic arch. The catheter was placed into the right subclavian artery and then advanced into the right vertebral artery. Frontal and lateral angiograms of the head were obtained. Normal opacification of the posterior circulation again seen with brisk opacification of the right ACA and MCA vascular tree seen by a right posterior communicating artery. Distal contrast penetration of MCA branches noted. The catheter was subsequently withdrawn. The right common femoral artery angiogram was performed via sheath side port. Atherosclerotic changes of the right common iliac, external and femoral arteries noted with mild to moderate stenosis at the origin of the right superficial femoral artery. The femoral sheath was exchanged over the wire for an 8 French Angio-Seal closure device which was utilized for access closure. Immediate hemostasis was achieved. IMPRESSION: 1. Attempted revascularization of the chronically occluded right ICA bulb unsuccessful. 2. Mechanical thrombectomy of the right M1/MCA  performed via trans circulation approach (left ICA-left A1/ACA-anterior communicating artery-right A1/ACA-right MCA). One pass performed with stent retriever with likely near complete recanalization (TICI2b). Evaluation limited due to contrast dilution from washout from right posterior communicating artery. 3. Embolus to left A3/ACA treated with 1 stent retriever pass with near complete recanalization (TICI 2C). 4. No hemorrhagic complication on postprocedural flat panel CT. PLAN: - Bed rest post femoral access 6h - ICU level of care- SBP 140-160- STAT head CT for any acute neurological deterioration Electronically Signed   By: Pedro Earls M.D.   On: 12/19/2019 15:40      HISTORY OF PRESENT ILLNESS Roczen Waymire is an 79 y.o. male  With PMH parkinson's disease, HTN, COPD ( baseline 2L O2), DM2, CAD who presented to [facility] ED as a code stroke for c/o left facial droop right gaze.   Patient  wasLKW at 0800 on 12/17/2019 when he fell and hit the back of his head. He was a little dizzy afterwards but no LOC. At 0900 he became dizzy again with some facial droop and EMS was called. Denies CP, blurred vision, LOC.   On Thursday, had a transurethral procedure for hematuria-questionable mass in the bladder.  Done at [facility].  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 11/29/12 pre-op surgical clearance note from [facility].  Modified Rankin: Rankin Score=1. NIHSS:8. tPA not given as outside of window. CTA showed R MCA infarct w/ 157 mismatch. Taken to IR for mechanical thrombectomy.    HOSPITAL COURSE Mr. Matthieu Loftus is a 79 y.o. male with history of parkinson's disease, HTN, COPD ( baseline 2L O2), DM2, CAD, 2nd COVID vaccine dose 08/2019, and on 12/15/19 he had a transurethral procedure at [facility] for hematuria-questionable mass in the bladder.  He presented to [facility] ED on 12/17/19 as a code stroke for c/o left facial droop right gaze. Patient was LKW at 0800 when he fell and hit the back of his head. He was a little dizzy afterwards but no LOC. He did not receive IV t-PA due to late presentation. 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 Taken for mechanical thrombectomy.     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 CT head  right lentiform nucleus, crossing the internal capsule and portions of the right caudate head infarct. And posterior right insular ribbon infarct. ASPECTS is 6/10. CTA head and neck  Right MCA territory infarct core 38 ml, proximal right M1 CT Perfusion -  Right MCA territory infarct with core infarct of 38 mL Cerebral angio - Right ICA chronic occlusion; right M1 acute occlusion s/p Mechanical thrombectomy right MCA (likely TICI2B) w/ transcirculation approach.  Embolus to the distal left A3 noted. recanalization up to the A4 segment.  MRI head - Patchy multifocal right MCA territory infarcts. flow void right ICA to the cavernous segment, c/w known chronic right ICA occlusion. cerebral atrophy with mild chronic small vessel ischemic disease.  2D Echo - EF 60-65%. No source of embolus  Lacey Jensen Virus 2 - negative LDL - 70 HgbA1c - 5.8 aspirin 81 mg daily prior to admission, now on aspirin 81. Add plavix x 3 weeks then continue plavix alone  Therapy recommendations:   CIR->pt prefers SNF Disposition:  SNF (lives alone, cousin next door)   Hypertension Home BP meds: Cozaar  ;  Imdur Current BP meds: Cozaar Add imdur at d/c Long-term BP goal normotensive   Diabetes, type II controlled HgbA1c 5.8, at goal < 7.0   Hyperlipidemia Home Lipid lowering medication: Pravachol 20 mg daily LDL 70, goal < 70 Current lipid lowering medication: Pravachol 20 mg Will not use intensive statin given LDL at goal Continue statin at discharge   Other Stroke Risk Factors Advanced age Former cigarette smoker - quit   ETOH use, advised to drink no more than 1 alcoholic beverage per day. Coronary artery disease OSA No hx stroke   Other Active Problems Aortic Atherosclerosis (ICD10-I70.0)  Emphysema (WHQ75-F16.9) Mild thrombocytopenia Recent prostate / bladder procedure for hematuria. Admitted with foley from procedure. Keep foley at d/c. Recent fall Parkinson's Disease COPD on home O2   DISCHARGE EXAM Blood pressure (!) 145/79, pulse 60, temperature 97.8 F (36.6 C), temperature source Oral, resp. rate 20, height 6' (1.829 m), weight 87.8 kg, SpO2 95 %. GENERAL: Pleasant elderly Caucasian male not in distress.   HEENT: Neck is supple and no trauma noted.    Hearing is impaired significantly.   ABDOMEN: soft   EXTREMITIES: No edema    BACK: Normal   SKIN: Normal by inspection.    MENTAL STATUS: He is awake and alert.  He does converses  decently.  He does follow commands briskly.  There is mild dysarthria noted.   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   MOTOR: Right upper and lower extremities show 4/5 strength.  Left upper extremity 3/5 and the left lower extremity 4.  Diminished fine finger movements on the left.  Orbits right over left upper extremity.  Left grip weakness.  Mild action tremor of left upper extremity greater than right increase with position holding   COORDINATION: No tremors, dysmetria or myoclonus noted.   SENSATION: Normal to light touch and pain.   Discharge Diet   Heart healthy thin liquids  DISCHARGE PLAN Disposition:  Skilled nursing facility for ongoing PT, OT and ST.  aspirin 81 mg daily and clopidogrel 75 mg daily for secondary stroke prevention for 3 weeks then PLAVIX alone. Ongoing stroke risk factor control by Primary Care Physician at time of discharge Follow-up PCP Raina Mina., MD in 2 weeks. Follow-up in [facility] in 4 weeks, office to schedule an appointment.   35 minutes were spent preparing discharge.  Burnetta Sabin, MSN, APRN, ANVP-BC, AGPCNP-BC Advanced Practice Stroke Nurse [place] for Schedule & Pager information 12/22/2019 1:17 PM   I have personally obtained history,examined this patient, reviewed notes, independently viewed imaging studies, participated in medical decision making and plan of care.ROS completed by me personally and pertinent positives fully documented  I have made any additions or clarifications directly to the above note. Agree with note above.    Antony Contras,  MD Medical Director [place] Pager: [phone] 12/22/2019 2:02 PM

## 2019-12-22 NOTE — TOC Progression Note (Signed)
Transition of Care [facility]) - Progression Note    Patient Details  Name: Allen Barnes MRN: [id number] Date of Birth: 1940-09-08  Transition of Care [facility]) CM/SW [place], [place] Phone Number: 12/22/2019, 11:37 AM  Clinical Narrative:   CSW received update from [facility] that they are unable to offer a bed for the patient at this time. CSW updated patient and brothers at bedside, they are interested in [place] in [place]. CSW contacted [facility], spoke with [place] in Admissions. CSW faxed referral, waiting on decision. CSW to follow.    Expected Discharge Plan: [place] Barriers to Discharge: Continued Medical Work up  Expected Discharge Plan and Services Expected Discharge Plan: [place] arrangements for the past 2 months: Single Family Home                                       Social Determinants of Health (SDOH) Interventions    Readmission Risk Interventions No flowsheet data found.

## 2019-12-22 NOTE — NC FL2 (Addendum)
[place] MEDICAID FL2 LEVEL OF CARE SCREENING TOOL     IDENTIFICATION  Patient Name: Allen Barnes Birthdate: [birth year] Sex: male Admission Date (Current Location): 12/17/2019  [facility] and [place] Number:  Herbalist and Address:  [facility], [place] [address], [place], [place] [address]      Provider Number: [id number]  Attending Physician Name and Address:  Garvin Fila, MD  Relative Name and Phone Number:       Current Level of Care: Hospital Recommended Level of Care: [place] Prior Approval Number:    Date Approved/Denied:   PASRR Number: [id number]  Discharge Plan: SNF    Current Diagnoses: Patient Active Problem List   Diagnosis Date Noted   Acute ischemic stroke ([place]) 12/17/2019   Encounter for intubation    COPD exacerbation ([place])    Microscopic hematuria 06/02/2019   Atherosclerosis of abdominal aorta ([place]) 02/21/2019   Plantar fat pad atrophy of right foot 02/02/2019   Pain of right heel 12/02/2018   Acquired plantar porokeratosis 11/18/2018   Atherosclerosis of both carotid arteries 05/18/2018   Calculus of gallbladder without cholecystitis without obstruction 05/18/2018   Nicotine dependence, uncomplicated 53/97/6734   Plantar fasciitis of right foot 03/11/2016   Callus of foot 12/11/2015   Metatarsalgia of right foot 12/11/2015   Colon polyps 09/03/2015   Primary osteoarthritis involving multiple joints 09/03/2015   Vitamin B12 deficiency 09/03/2015   Anxiety disorder 08/31/2015   Asthma 08/31/2015   BPH without urinary obstruction 08/31/2015   COPD (chronic obstructive pulmonary disease) with chronic bronchitis ([place]) 08/31/2015   Coronary artery stenosis 08/31/2015   Diabetes mellitus type 2, controlled ([place]) 08/31/2015   Essential hypertension 08/31/2015   High risk medication use 08/31/2015   Insomnia 08/31/2015   Mixed hyperlipidemia 08/31/2015   Parkinson's disease ([place]) 08/31/2015    Sleep apnea 08/31/2015   Coronary artery disease involving native coronary artery of native heart without angina pectoris 04/10/2015   Smoking 04/10/2015    Orientation RESPIRATION BLADDER Height & Weight     Self, Time, Situation, Place  Normal Continent Weight: 193 lb 9 oz (87.8 kg) Height:  6' (182.9 cm)  BEHAVIORAL SYMPTOMS/MOOD NEUROLOGICAL BOWEL NUTRITION STATUS      Continent Diet (See discharge summary)  AMBULATORY STATUS COMMUNICATION OF NEEDS Skin   Extensive Assist Verbally Surgical wounds (closed right groin, gauze dressing changed PRN)                       Personal Care Assistance Level of Assistance  Bathing, Feeding, Dressing Bathing Assistance: Maximum assistance Feeding assistance: Limited assistance Dressing Assistance: Maximum assistance     Functional Limitations Info  Sight, Hearing, Speech Sight Info: Adequate Hearing Info: Impaired Speech Info: Adequate    SPECIAL CARE FACTORS FREQUENCY  PT (By licensed PT), OT (By licensed OT)     PT Frequency: 5x a week OT Frequency: 5x a week            Contractures Contractures Info: Not present    Additional Factors Info  Code Status, Allergies Code Status Info: Full Allergies Info: Carbidopa-levodopa Tramadol           Current Medications (12/22/2019):  This is the current hospital active medication list Current Facility-Administered Medications  Medication Dose Route Frequency Provider Last Rate Last Admin    stroke: mapping our early stages of recovery book   Does not apply Once Donzetta Starch, NP  0.9 %  sodium chloride infusion   Intravenous Continuous Donzetta Starch, NP 75 mL/hr at 12/22/19 0631 Rate Verify at 12/22/19 0631   acetaminophen (TYLENOL) tablet 650 mg  650 mg Oral Q4H PRN Donzetta Starch, NP   650 mg at 12/21/19 2313   Or   acetaminophen (TYLENOL) 160 MG/5ML solution 650 mg  650 mg Per Tube Q4H PRN Donzetta Starch, NP       Or   acetaminophen (TYLENOL) suppository 650 mg   650 mg Rectal Q4H PRN Donzetta Starch, NP       aspirin EC tablet 81 mg  81 mg Oral Daily Burnetta Sabin L, NP   81 mg at 12/22/19 1108   Chlorhexidine Gluconate Cloth 2 % PADS 6 each  6 each Topical Daily Donzetta Starch, NP   6 each at 12/22/19 1110   docusate sodium (COLACE) capsule 100 mg  100 mg Oral BID Burnetta Sabin L, NP   100 mg at 12/22/19 1108   fluticasone furoate-vilanterol (BREO ELLIPTA) 200-25 MCG/INH 1 puff  1 puff Inhalation Daily Burnetta Sabin L, NP   1 puff at 12/22/19 0835   insulin aspart (novoLOG) injection 0-15 Units  0-15 Units Subcutaneous Q4H Donzetta Starch, NP   3 Units at 12/20/19 2045   ipratropium-albuterol (DUONEB) 0.5-2.5 (3) MG/3ML nebulizer solution 3 mL  3 mL Nebulization BID Garvin Fila, MD   3 mL at 12/22/19 0835   losartan (COZAAR) tablet 100 mg  100 mg Oral Daily Garvin Fila, MD   100 mg at 12/22/19 1110   pantoprazole (PROTONIX) EC tablet 40 mg  40 mg Oral Daily Burnetta Sabin L, NP   40 mg at 12/22/19 1109   pravastatin (PRAVACHOL) tablet 20 mg  20 mg Oral Daily Burnetta Sabin L, NP   20 mg at 12/22/19 1109   senna-docusate (Senokot-S) tablet 1 tablet  1 tablet Oral QHS PRN Donzetta Starch, NP       tamsulosin (FLOMAX) capsule 0.4 mg  0.4 mg Oral BID Donzetta Starch, NP   0.4 mg at 12/22/19 1108     Discharge Medications: Please see discharge summary for a list of discharge medications.  Relevant Imaging Results:  Relevant Lab Results:   Additional Information SSN [id number]  Geralynn Ochs, LCSW  I have personally obtained history,examined this patient, reviewed notes, independently viewed imaging studies, participated in medical decision making and plan of care.ROS completed by me personally and pertinent positives fully documented  I have made any additions or clarifications directly to the above note. Agree with note above.    Antony Contras, MD Medical Director [place] Pager: [phone] 12/22/2019 2:01 PM

## 2019-12-22 NOTE — TOC Transition Note (Signed)
Transition of Care [facility]) - CM/SW Discharge Note   Patient Details  Name: Allen Barnes MRN: [id number] Date of Birth: 09-01-1940  Transition [facility]) CM/SW Contact:  Geralynn Ochs, LCSW Phone Number: 12/22/2019, 2:13 PM   Clinical Narrative:   Nurse to call report to [phone], Room 143    Final next level of care: Skilled Nursing Facility Barriers to Discharge: Barriers Resolved   Patient Goals and CMS Choice Patient states their goals for this hospitalization and ongoing recovery are:: Pt was unable to verbalize goals CMS Medicare.gov Compare Post Acute Care list provided to:: Patient Choice offered to / list presented to : Patient  Discharge Placement              Patient chooses bed at: [facility] Patient to be transferred to facility by: [place] Name of family member notified: Elenore Rota Patient and family notified of of transfer: 12/22/19  Discharge Plan and Services                                     Social Determinants of Health (SDOH) Interventions     Readmission Risk Interventions No flowsheet data found.

## 2019-12-22 NOTE — Progress Notes (Signed)
Report called in and given to Hildred Alamin, Therapist, sports at [facility] and rehab center. All questions answered for [facility]. Family is aware of patient's discharge. [facility] has been called. Awaiting transport at this time.

## 2020-02-07 ENCOUNTER — Other Ambulatory Visit: Payer: Self-pay

## 2020-02-07 ENCOUNTER — Encounter: Payer: Self-pay | Admitting: Cardiology

## 2020-02-07 ENCOUNTER — Ambulatory Visit (INDEPENDENT_AMBULATORY_CARE_PROVIDER_SITE_OTHER): Payer: Medicare Other | Admitting: Cardiology

## 2020-02-07 VITALS — BP 134/78 | HR 62 | Ht 72.0 in | Wt 188.4 lb

## 2020-02-07 DIAGNOSIS — I639 Cerebral infarction, unspecified: Secondary | ICD-10-CM | POA: Diagnosis not present

## 2020-02-07 DIAGNOSIS — I2510 Atherosclerotic heart disease of native coronary artery without angina pectoris: Secondary | ICD-10-CM | POA: Diagnosis not present

## 2020-02-07 DIAGNOSIS — I6523 Occlusion and stenosis of bilateral carotid arteries: Secondary | ICD-10-CM

## 2020-02-07 DIAGNOSIS — R002 Palpitations: Secondary | ICD-10-CM

## 2020-02-07 NOTE — Patient Instructions (Signed)
Medication Instructions:  Your physician recommends that you continue on your current medications as directed. Please refer to the Current Medication list given to you today.  *If you need a refill on your cardiac medications before your next appointment, please call your pharmacy*   Lab Work: None.  If you have labs (blood work) drawn today and your tests are completely normal, you will receive your results only by: Marland Kitchen MyChart Message (if you have MyChart) OR . A paper copy in the mail If you have any lab test that is abnormal or we need to change your treatment, we will call you to review the results.   Testing/Procedures: A zio monitor was ordered today. It will remain on for 14 days. You will then return monitor and event diary in provided box. It takes 1-2 weeks for report to be downloaded and returned to [place]. We will call you with the results. If monitor falls off or has orange flashing light, please call Zio for further instructions.      Follow-Up: At [facility], you and your health needs are our priority.  As part of our continuing mission to provide you with exceptional heart care, we have created designated Provider Care Teams.  These Care Teams include your primary Cardiologist (physician) and Advanced Practice Providers (APPs -  Physician Assistants and Nurse Practitioners) who all work together to provide you with the care you need, when you need it.  We recommend signing up for the patient portal called "MyChart".  Sign up information is provided on this After Visit Summary.  MyChart is used to connect with patients for Virtual Visits (Telemedicine).  Patients are able to view lab/test results, encounter notes, upcoming appointments, etc.  Non-urgent messages can be sent to your provider as well.   To learn more about what you can do with MyChart, go to [email or web address].    Your next appointment:   5 month(s)  The format for your next appointment:   In  Person  Provider:   Jenne Campus, MD   Other Instructions

## 2020-02-07 NOTE — Progress Notes (Signed)
Cardiology Office Note:    Date:  02/07/2020   ID:  Allen Barnes, DOB 08/23/1940, MRN [id number]  PCP:  Raina Mina., MD  Cardiologist:  Jenne Campus, MD    Referring MD: Raina Mina., MD   No chief complaint on file. I am doing better  History of Present Illness:    Allen Barnes is a 79 y.o. male with past medical history significant for cardiac catheterization 2007 showing only luminal disease, he did have coronary calcifications.  However stress test done after that showed no evidence of ischemia.  Only showed possibility of old myocardial infarction.  Recently he ended up going to the hospital because of acute stroke.  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  He seems to be doing better since that time.  He is very hard of hearing therefore conversation was somewhat difficult but he said he is doing fine.  Denies have any chest pain tightness squeezing pressure burning chest.  Past Medical History:  Diagnosis Date  . Acquired plantar porokeratosis 11/18/2018  . Anxiety disorder 08/31/2015  . Asthma 08/31/2015  . Atherosclerosis of abdominal aorta ([place]) 02/21/2019   Seen on CT by urologist. 12/2018.  Formatting of this note might be different from the original. Seen on CT by urologist. 12/2018.  Marland Kitchen Atherosclerosis of both carotid arteries 05/18/2018   Right occluded. Left <50% on 04/2018 [place].  Formatting of this note might be different from the original. Right occluded. Left <50% on 04/2018 [place].  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 Calculus of gallbladder without cholecystitis without obstruction 05/18/2018   Asymptomatic. Seen on CT 04/2018  Formatting of this note might be different from the original. Asymptomatic. Seen on CT 04/2018  . Callus of foot 12/11/2015  . Colon polyps 09/03/2015   Overview:  [place]. 2017. Polyps.  Q 3 yrs.  Marland Kitchen COPD (chronic obstructive pulmonary disease) with  chronic bronchitis ([place]) 08/31/2015   Overview:  Wears O2 at night.  . 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 30% LAD in 2007  Rare. [name]  . Coronary artery disease involving native coronary artery of native heart without angina pectoris 04/10/2015   Overview:  30% of LAD in 2007  . Coronary artery stenosis 08/31/2015   Overview:  30% LAD in 2007  . Diabetes mellitus type 2, controlled ([place]) 08/31/2015  . Essential hypertension 08/31/2015  . High risk medication use 08/31/2015  . Insomnia 08/31/2015  . Metatarsalgia of right foot 12/11/2015  . Microscopic hematuria 06/02/2019  . Mixed hyperlipidemia 08/31/2015  . Nicotine dependence, uncomplicated 3/83/2919   Overview:  Former smoker.  Uses vaping  Formatting of this note might be different from the original. Former smoker.  Uses vaping  . Pain of right heel 12/02/2018  . Parkinson's disease ([place]) 08/31/2015  . Plantar fasciitis of right foot 03/11/2016  . 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 Primary osteoarthritis involving multiple joints 09/03/2015  . Sleep apnea 08/31/2015  . Smoking 04/10/2015  . Vitamin B12 deficiency 09/03/2015    Past Surgical History:  Procedure Laterality Date  . BACK SURGERY    . CATARACT EXTRACTION    . heel Right   . IR ANGIO INTRA EXTRACRAN SEL INTERNAL CAROTID UNI R MOD SED  12/17/2019  . IR ANGIO VERTEBRAL  SEL SUBCLAVIAN INNOMINATE BILAT MOD SED  12/17/2019  . IR CT HEAD LTD  12/17/2019  . IR PERCUTANEOUS ART THROMBECTOMY/INFUSION INTRACRANIAL INC DIAG ANGIO  12/17/2019  . KNEE SURGERY Right   . RADIOLOGY WITH ANESTHESIA N/A 12/17/2019   Procedure: IR WITH ANESTHESIA;  Surgeon: Radiologist, Medication, MD;  Location: [place];  Service: Radiology;  Laterality: N/A;  . SHOULDER SURGERY Left     Current Medications: Current Meds  Medication Sig  . acetaminophen (TYLENOL) 500 MG  tablet Take 500 mg by mouth 2 (two) times daily.  Marland Kitchen ALPRAZolam (XANAX) 1 MG tablet Take 1 mg by mouth at bedtime.  Marland Kitchen BREO ELLIPTA 200-25 MCG/INH AEPB Inhale 1 puff into the lungs daily.   . clopidogrel (PLAVIX) 75 MG tablet Take 1 tablet (75 mg total) by mouth daily.  . clotrimazole-betamethasone (LOTRISONE) cream Apply 1 application topically as directed.  . Cyanocobalamin (RA VITAMIN B-12 TR) 1000 MCG TBCR Take 1 tablet by mouth daily.   Marland Kitchen docusate sodium (COLACE) 50 MG capsule Take 50 mg by mouth as needed for mild constipation.  Marland Kitchen losartan (COZAAR) 100 MG tablet Take 100 mg by mouth daily.   . OXYGEN Inhale 2 L into the lungs at bedtime.   . pravastatin (PRAVACHOL) 20 MG tablet Take 20 mg by mouth daily.   . SYMBICORT 160-4.5 MCG/ACT inhaler Inhale 2 puffs into the lungs 2 (two) times daily.   . tamsulosin (FLOMAX) 0.4 MG CAPS capsule 0.4 mg 2 (two) times daily.   Marland Kitchen triamcinolone cream (KENALOG) 0.1 % Apply 1 application topically 2 (two) times daily as needed.     Allergies:   Carbidopa-levodopa and Tramadol   Social History   Socioeconomic History  . Marital status: Divorced    Spouse name: Not on file  . Number of children: Not on file  . Years of education: Not on file  . Highest education level: Not on file  Occupational History  . Not on file  Tobacco Use  . Smoking status: Former Smoker    Packs/day: 0.50    Years: 27.00    Pack years: 13.50    Types: Cigarettes  . Smokeless tobacco: Current User  Vaping Use  . Vaping Use: Former  Substance and Sexual Activity  . Alcohol use: Yes    Alcohol/week: 1.0 standard drink    Types: 1 Cans of beer per week  . Drug use: Never  . Sexual activity: Not Currently    Partners: Female  Other Topics Concern  . Not on file  Social History Narrative  . Not on file   Social Determinants of Health   Financial Resource Strain:   . Difficulty of Paying Living Expenses:   Food Insecurity:   . Worried About Charity fundraiser  in the Last Year:   . Arboriculturist in the Last Year:   Transportation Needs:   . Film/video editor (Medical):   Marland Kitchen Lack of Transportation (Non-Medical):   Physical Activity:   . Days of Exercise per Week:   . Minutes of Exercise per Session:   Stress:   . Feeling of Stress :   Social Connections:   . Frequency of Communication with Friends and Family:   . Frequency of Social Gatherings with Friends and Family:   . Attends Religious Services:   . Active Member of Clubs or Organizations:   . Attends Archivist Meetings:   Marland Kitchen Marital Status:      Family History: The  patient's family history includes Heart disease in his father; Lung cancer in his mother. ROS:   Please see the history of present illness.    All 14 point review of systems negative except as described per history of present illness  EKGs/Labs/Other Studies Reviewed:      Recent Labs: 12/17/2019: ALT 15 12/19/2019: BUN 10; Creatinine, Ser 1.07; Hemoglobin 11.8; Magnesium 1.6; Platelets 121; Potassium 3.8; Sodium 139  Recent Lipid Panel    Component Value Date/Time   CHOL 148 12/18/2019 0456   TRIG 111 12/18/2019 0456   TRIG 114 12/18/2019 0456   HDL 56 12/18/2019 0456   CHOLHDL 2.6 12/18/2019 0456   VLDL 22 12/18/2019 0456   LDLCALC 70 12/18/2019 0456    Physical Exam:    VS:  BP 134/78   Pulse 62   Ht 6' (1.829 m)   Wt 188 lb 6.4 oz (85.5 kg)   SpO2 94%   BMI 25.55 kg/m     Wt Readings from Last 3 Encounters:  02/07/20 188 lb 6.4 oz (85.5 kg)  12/17/19 193 lb 9 oz (87.8 kg)  10/14/19 191 lb 12.8 oz (87 kg)     GEN:  Well nourished, well developed in no acute distress HEENT: Normal NECK: No JVD; No carotid bruits LYMPHATICS: No lymphadenopathy CARDIAC: RRR, no murmurs, no rubs, no gallops RESPIRATORY:  Clear to auscultation without rales, wheezing or rhonchi  ABDOMEN: Soft, non-tender, non-distended MUSCULOSKELETAL:  No edema; No deformity  SKIN: Warm and dry LOWER  EXTREMITIES: no swelling NEUROLOGIC:  Alert and oriented x 3 PSYCHIATRIC:  Normal affect   ASSESSMENT:    1. Coronary artery disease involving native coronary artery of native heart without angina pectoris   2. Atherosclerosis of both carotid arteries    PLAN:    In order of problems listed above:  1. Coronary disease stable from that point review denies having a problem recently.  We will continue present management. 2. Recent admission to the hospital with acute stroke.  I did review his chart.  It looks like his stroke was related to complete occlusion of the right coronary artery.  I put Zio patch for him for about 2 weeks to see if he got any significant arrhythmia however based on the story that I see from his chart I doubt that we discovered some significant arrhythmia. 3. Dyslipidemia: He is taking pravastatin right now which helped continue.  I do have his K PN with LDL of 70 and HDL of 56 this is from December 18, 2019.  We will consider up titrating his statin therapy. 4. COPD.  Chronic problem stable. 5. History of smoking: Does not smoke since January.  I encouraged him to stay away from smoking   Medication Adjustments/Labs and Tests Ordered: Current medicines are reviewed at length with the patient today.  Concerns regarding medicines are outlined above.  No orders of the defined types were placed in this encounter.  Medication changes: No orders of the defined types were placed in this encounter.   Signed, Park Liter, MD, [facility] 02/07/2020 1:19 PM    [place]

## 2020-02-10 ENCOUNTER — Ambulatory Visit (INDEPENDENT_AMBULATORY_CARE_PROVIDER_SITE_OTHER): Payer: Medicare Other

## 2020-02-10 DIAGNOSIS — R002 Palpitations: Secondary | ICD-10-CM | POA: Diagnosis not present

## 2020-02-10 DIAGNOSIS — I639 Cerebral infarction, unspecified: Secondary | ICD-10-CM | POA: Diagnosis not present

## 2020-02-13 ENCOUNTER — Telehealth: Payer: Self-pay | Admitting: Cardiology

## 2020-02-13 NOTE — Telephone Encounter (Signed)
Pt states that after slamming on breaks today and the seatbelt catching his chest with the monior he says that the monitor is leaking something red. Phone number was given to the patient for [facility] and he was advised to call them for advice and recommendations. Pt verbalized understanding and had no additional questions.

## 2020-02-13 NOTE — Telephone Encounter (Signed)
  1. Is this related to a heart monitor you are wearing?  (If the patient says no, please ask     if they are caling about ICD/pacemaker.) Yes  2. What is your issue??  (If the patient is calling for results of the heart monitor this     message should be sent to nurse.) States a liquid is coming out on to his shirt and is concerned    Please route to covering RN/CMA/RMA for results. Route to monitor technicians or your monitor tech representative for your site for any technical concerns

## 2020-03-15 ENCOUNTER — Other Ambulatory Visit: Payer: Self-pay

## 2020-03-15 NOTE — Patient Outreach (Signed)
[facility]) Care Management  03/15/2020  Allen Barnes 1941/02/21 [id number]   Telephone outreach to patient to obtain mRS was successfully completed. MRS=1.  [place] Management Assistant [phone]

## 2020-03-18 DIAGNOSIS — I517 Cardiomegaly: Secondary | ICD-10-CM | POA: Diagnosis not present

## 2020-03-20 DIAGNOSIS — J441 Chronic obstructive pulmonary disease with (acute) exacerbation: Secondary | ICD-10-CM

## 2020-03-20 DIAGNOSIS — Z8673 Personal history of transient ischemic attack (TIA), and cerebral infarction without residual deficits: Secondary | ICD-10-CM

## 2020-03-20 HISTORY — DX: Chronic obstructive pulmonary disease with (acute) exacerbation: J44.1

## 2020-03-20 HISTORY — DX: Personal history of transient ischemic attack (TIA), and cerebral infarction without residual deficits: Z86.73

## 2020-07-09 ENCOUNTER — Other Ambulatory Visit: Payer: Self-pay

## 2020-07-10 ENCOUNTER — Ambulatory Visit (INDEPENDENT_AMBULATORY_CARE_PROVIDER_SITE_OTHER): Payer: Medicare Other | Admitting: Cardiology

## 2020-07-10 ENCOUNTER — Other Ambulatory Visit: Payer: Self-pay

## 2020-07-10 ENCOUNTER — Encounter: Payer: Self-pay | Admitting: Cardiology

## 2020-07-10 VITALS — BP 140/78 | HR 50 | Ht 72.0 in | Wt 179.0 lb

## 2020-07-10 DIAGNOSIS — I10 Essential (primary) hypertension: Secondary | ICD-10-CM

## 2020-07-10 DIAGNOSIS — I6930 Unspecified sequelae of cerebral infarction: Secondary | ICD-10-CM | POA: Insufficient documentation

## 2020-07-10 DIAGNOSIS — J449 Chronic obstructive pulmonary disease, unspecified: Secondary | ICD-10-CM

## 2020-07-10 DIAGNOSIS — I2510 Atherosclerotic heart disease of native coronary artery without angina pectoris: Secondary | ICD-10-CM

## 2020-07-10 NOTE — Progress Notes (Signed)
Cardiology Office Note:    Date:  07/10/2020   ID:  [name], DOB June 02, 1941, MRN [id number]  PCP:  Raina Mina., MD  Cardiologist:  Jenne Campus, MD    Referring MD: Raina Mina., MD   Chief Complaint  Patient presents with  . Follow-up  I am doing fine  History of Present Illness:    Allen Barnes is a 80 y.o. male with past medical history significant for coronary artery disease. He did have a cardiac catheterization in 2007 showing luminal disease. He also have coronary calcifications however stress test done after that showed no evidence of ischemia. Recently she ended going to the hospital because of acute stroke, he was found to have total occlusion of right middle cerebral artery it was advised with removal of the blood clot with significant improvement. He comes today to my office for follow-up. Overall conversation was very difficult he is hard of hearing denies have any cardiac complaints. She probably told me that he tries to smoke again however he was not able to tolerate it and he said he will never go unattached cigarettes again. Denies have any palpitations, no chest pain tightness squeezing pressure burning chest.  Past Medical History:  Diagnosis Date  . Acquired plantar porokeratosis 11/18/2018  . Acute ischemic stroke ([place]) R MCA s/p mechanical thrombectomy. thromboembolic source from R ICA 12/17/2019  . Anxiety disorder 08/31/2015  . Asthma 08/31/2015  . Atherosclerosis of abdominal aorta ([place]) 02/21/2019   Seen on CT by urologist. 12/2018.  Formatting of this note might be different from the original. Seen on CT by urologist. 12/2018.  Marland Kitchen Atherosclerosis of both carotid arteries 05/18/2018   Right occluded. Left <50% on 04/2018 [place].  Formatting of this note might be different from the original. Right occluded. Left <50% on 04/2018 [place].  . 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 Nila Nephew. 10/2019  . BPH without urinary  obstruction 08/31/2015  . Calculus of gallbladder without cholecystitis without obstruction 05/18/2018   Asymptomatic. Seen on CT 04/2018  Formatting of this note might be different from the original. Asymptomatic. Seen on CT 04/2018  . Callus of foot 12/11/2015  . Colon polyps 09/03/2015   Overview:  [place]. 2017. Polyps.  Q 3 yrs.  Marland Kitchen COPD (chronic obstructive pulmonary disease) with chronic bronchitis ([place]) 08/31/2015   Overview:  Wears O2 at night.  Marland Kitchen COPD exacerbation ([place]) 03/20/2020  . 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 30% LAD in 2007  Rare. Orley Lawry  . Coronary artery disease involving native coronary artery of native heart without angina pectoris 04/10/2015   Overview:  30% of LAD in 2007  . Coronary artery stenosis 08/31/2015   Overview:  30% LAD in 2007  . Diabetes mellitus type 2, controlled ([place]) 08/31/2015  . Encounter for intubation   . Essential hypertension 08/31/2015  . High risk medication use 08/31/2015  . History of TIA (transient ischemic attack) 03/20/2020  . Insomnia 08/31/2015  . Metatarsalgia of right foot 12/11/2015  . Microscopic hematuria 06/02/2019  . Mixed hyperlipidemia 08/31/2015  . Nicotine dependence, uncomplicated 6/60/6301   Overview:  Former smoker.  Uses vaping  Formatting of this note might be different from the original. Former smoker.  Uses vaping  . Pain of right heel 12/02/2018  . Parkinson's disease ([place]) 08/31/2015  . Plantar fasciitis of  right foot 03/11/2016  . 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 Prediabetes 08/31/2015  . Primary osteoarthritis involving multiple joints 09/03/2015  . Sleep apnea 08/31/2015  . Smoking 04/10/2015  . Vitamin B12 deficiency 09/03/2015    Past Surgical History:  Procedure Laterality Date  . BACK SURGERY    . CATARACT EXTRACTION    . heel Right   . IR ANGIO INTRA EXTRACRAN SEL INTERNAL  CAROTID UNI R MOD SED  12/17/2019  . IR ANGIO VERTEBRAL SEL SUBCLAVIAN INNOMINATE BILAT MOD SED  12/17/2019  . IR CT HEAD LTD  12/17/2019  . IR PERCUTANEOUS ART THROMBECTOMY/INFUSION INTRACRANIAL INC DIAG ANGIO  12/17/2019  . KNEE SURGERY Right   . RADIOLOGY WITH ANESTHESIA N/A 12/17/2019   Procedure: IR WITH ANESTHESIA;  Surgeon: Radiologist, Medication, MD;  Location: [place];  Service: Radiology;  Laterality: N/A;  . SHOULDER SURGERY Left     Current Medications: Current Meds  Medication Sig  . acetaminophen (TYLENOL) 500 MG tablet Take 500 mg by mouth 2 (two) times daily.  Marland Kitchen atorvastatin (LIPITOR) 40 MG tablet Take 40 mg by mouth daily.  Marland Kitchen BREO ELLIPTA 200-25 MCG/INH AEPB Inhale 1 puff into the lungs daily.   . Cyanocobalamin 1000 MCG TBCR Take 1 tablet by mouth daily.   Marland Kitchen docusate sodium (COLACE) 50 MG capsule Take 50 mg by mouth as needed for mild constipation.  . isosorbide mononitrate (IMDUR) 30 MG 24 hr tablet Take 1 tablet (30 mg total) by mouth daily.  Marland Kitchen losartan (COZAAR) 100 MG tablet Take 100 mg by mouth daily.   . nitroGLYCERIN (NITROSTAT) 0.4 MG SL tablet Place 1 tablet (0.4 mg total) under the tongue every 5 (five) minutes as needed for chest pain.  . OXYGEN Inhale 2 L into the lungs at bedtime.   . pravastatin (PRAVACHOL) 20 MG tablet Take 20 mg by mouth daily.   . SYMBICORT 160-4.5 MCG/ACT inhaler Inhale 2 puffs into the lungs 2 (two) times daily.   . tamsulosin (FLOMAX) 0.4 MG CAPS capsule 0.4 mg 2 (two) times daily.   Marland Kitchen triamcinolone cream (KENALOG) 0.1 % Apply 1 application topically 2 (two) times daily as needed.     Allergies:   Carbidopa-levodopa and Tramadol   Social History   Socioeconomic History  . Marital status: Divorced    Spouse name: Not on file  . Number of children: Not on file  . Years of education: Not on file  . Highest education level: Not on file  Occupational History  . Not on file  Tobacco Use  . Smoking status: Former Smoker    Packs/day:  0.50    Years: 27.00    Pack years: 13.50    Types: Cigarettes  . Smokeless tobacco: Current User  Vaping Use  . Vaping Use: Former  Substance and Sexual Activity  . Alcohol use: Yes    Alcohol/week: 1.0 standard drink    Types: 1 Cans of beer per week  . Drug use: Never  . Sexual activity: Not Currently    Partners: Female  Other Topics Concern  . Not on file  Social History Narrative  . Not on file   Social Determinants of Health   Financial Resource Strain: Not on file  Food Insecurity: Not on file  Transportation Needs: Not on file  Physical Activity: Not on file  Stress: Not on file  Social Connections: Not on file     Family History: The patient's family history includes Heart disease  in his father; Lung cancer in his mother. ROS:   Please see the history of present illness.    All 14 point review of systems negative except as described per history of present illness  EKGs/Labs/Other Studies Reviewed:      Recent Labs: 12/17/2019: ALT 15 12/19/2019: BUN 10; Creatinine, Ser 1.07; Hemoglobin 11.8; Magnesium 1.6; Platelets 121; Potassium 3.8; Sodium 139  Recent Lipid Panel    Component Value Date/Time   CHOL 148 12/18/2019 0456   TRIG 111 12/18/2019 0456   TRIG 114 12/18/2019 0456   HDL 56 12/18/2019 0456   CHOLHDL 2.6 12/18/2019 0456   VLDL 22 12/18/2019 0456   LDLCALC 70 12/18/2019 0456    Physical Exam:    VS:  BP 140/78 (BP Location: Left Arm, Patient Position: Sitting)   Pulse (!) 50   Ht 6' (1.829 m)   Wt 179 lb (81.2 kg)   SpO2 90%   BMI 24.28 kg/m     Wt Readings from Last 3 Encounters:  07/10/20 179 lb (81.2 kg)  02/07/20 188 lb 6.4 oz (85.5 kg)  12/17/19 193 lb 9 oz (87.8 kg)     GEN:  Well nourished, well developed in no acute distress HEENT: Normal NECK: No JVD; No carotid bruits LYMPHATICS: No lymphadenopathy CARDIAC: RRR, no murmurs, no rubs, no gallops RESPIRATORY:  Clear to auscultation without rales, wheezing or rhonchi   ABDOMEN: Soft, non-tender, non-distended MUSCULOSKELETAL:  No edema; No deformity  SKIN: Warm and dry LOWER EXTREMITIES: no swelling NEUROLOGIC:  Alert and oriented x 3 PSYCHIATRIC:  Normal affect   ASSESSMENT:    1. Coronary artery disease involving native coronary artery of native heart without angina pectoris   2. Essential hypertension   3. COPD (chronic obstructive pulmonary disease) with chronic bronchitis ([place])   4. Late effect of cerebrovascular accident (CVA)    PLAN:    In order of problems listed above:  1. Coronary disease stable from that point we will continue risk factors modification which includes antiplatelet therapy as well as statin. 2. Essential hypertension: Blood pressure well controlled continue present management. 3. COPD which is advanced likely he quit smoking and he tried cigarettes however he did not like it and I encouraged him to stay away from cigarettes. 4. Late effect of CVA. Stable. 5. Dyslipidemia: I did review his fasting lipid profile done in December 18, 2019 showing LDL of 70 HDL 56. This is from [facility]. There is a good cholesterol profile we will continue present management. 6. History of CVA. His monitor did not show any evidence of atrial fibrillation. Continue monitoring   Medication Adjustments/Labs and Tests Ordered: Current medicines are reviewed at length with the patient today.  Concerns regarding medicines are outlined above.  No orders of the defined types were placed in this encounter.  Medication changes: No orders of the defined types were placed in this encounter.   Signed, Park Liter, MD, [facility] 07/10/2020 11:51 AM    [place]

## 2020-07-10 NOTE — Patient Instructions (Signed)
Medication Instructions:  Your physician recommends that you continue on your current medications as directed. Please refer to the Current Medication list given to you today.  *If you need a refill on your cardiac medications before your next appointment, please call your pharmacy*   Lab Work: NONE If you have labs (blood work) drawn today and your tests are completely normal, you will receive your results only by: MyChart Message (if you have MyChart) OR A paper copy in the mail If you have any lab test that is abnormal or we need to change your treatment, we will call you to review the results.   Testing/Procedures: NONE   Follow-Up: At [facility], you and your health needs are our priority.  As part of our continuing mission to provide you with exceptional heart care, we have created designated Provider Care Teams.  These Care Teams include your primary Cardiologist (physician) and Advanced Practice Providers (APPs -  Physician Assistants and Nurse Practitioners) who all work together to provide you with the care you need, when you need it.  We recommend signing up for the patient portal called "MyChart".  Sign up information is provided on this After Visit Summary.  MyChart is used to connect with patients for Virtual Visits (Telemedicine).  Patients are able to view lab/test results, encounter notes, upcoming appointments, etc.  Non-urgent messages can be sent to your provider as well.   To learn more about what you can do with MyChart, go to [email or web address].    Your next appointment:   5 month(s)  The format for your next appointment:   In Person  Provider:   Robert Krasowski, MD    Other Instructions   

## 2020-08-14 ENCOUNTER — Other Ambulatory Visit: Payer: Self-pay | Admitting: Cardiology

## 2020-08-14 DIAGNOSIS — E782 Mixed hyperlipidemia: Secondary | ICD-10-CM

## 2020-08-14 DIAGNOSIS — G20A1 Parkinson's disease without dyskinesia, without mention of fluctuations: Secondary | ICD-10-CM

## 2020-08-14 DIAGNOSIS — G20 Parkinson's disease: Secondary | ICD-10-CM

## 2020-08-14 DIAGNOSIS — I2510 Atherosclerotic heart disease of native coronary artery without angina pectoris: Secondary | ICD-10-CM

## 2020-08-14 DIAGNOSIS — I10 Essential (primary) hypertension: Secondary | ICD-10-CM

## 2020-08-14 NOTE — Telephone Encounter (Signed)
Refill sent to pharmacy.   

## 2020-12-13 DIAGNOSIS — I10 Essential (primary) hypertension: Secondary | ICD-10-CM

## 2020-12-13 DIAGNOSIS — I361 Nonrheumatic tricuspid (valve) insufficiency: Secondary | ICD-10-CM

## 2020-12-13 DIAGNOSIS — Z01810 Encounter for preprocedural cardiovascular examination: Secondary | ICD-10-CM

## 2020-12-13 DIAGNOSIS — I272 Pulmonary hypertension, unspecified: Secondary | ICD-10-CM

## 2020-12-13 DIAGNOSIS — E785 Hyperlipidemia, unspecified: Secondary | ICD-10-CM

## 2020-12-13 DIAGNOSIS — I2510 Atherosclerotic heart disease of native coronary artery without angina pectoris: Secondary | ICD-10-CM

## 2020-12-14 DIAGNOSIS — I2510 Atherosclerotic heart disease of native coronary artery without angina pectoris: Secondary | ICD-10-CM | POA: Diagnosis not present

## 2020-12-14 DIAGNOSIS — E785 Hyperlipidemia, unspecified: Secondary | ICD-10-CM | POA: Diagnosis not present

## 2020-12-14 DIAGNOSIS — I10 Essential (primary) hypertension: Secondary | ICD-10-CM | POA: Diagnosis not present

## 2021-04-30 DEATH — deceased

## 2021-05-04 IMAGING — MR MR HEAD W/O CM
12 of 13 series · 44 of 48 positions shown · non-contrast
Comparison: Prior studies from 12/17/2019.

CLINICAL DATA: Follow-up examination for acute stroke. Status post
catheter directed intervention.

EXAM:
MRI HEAD WITHOUT CONTRAST
TECHNIQUE: Multiplanar, multiecho pulse sequences of the brain and surrounding
structures were obtained without intravenous contrast.

[Series 5: DWI · axial · 3.0mm · 0.88mm/px · z∈[-32,+115]mm · 8 of 104 slices shown (1 of 4)]
[im 1/104]
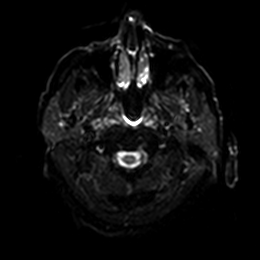
[im 15/104]
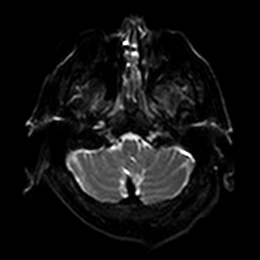
[im 30/104]
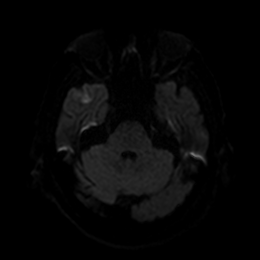
[im 45/104]
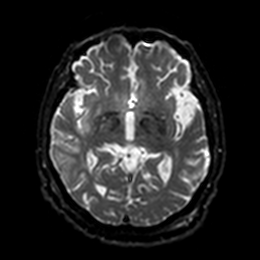
[im 59/104]
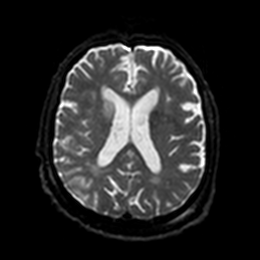
[im 74/104]
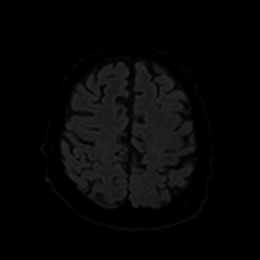
[im 89/104]
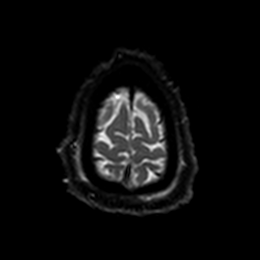
[im 104/104]
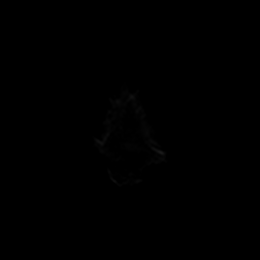

[Series 6: DWI · axial · 3.0mm · 0.88mm/px · z∈[-32,+115]mm · 4 of 52 slices shown (2 of 4)]
[im 1/52]
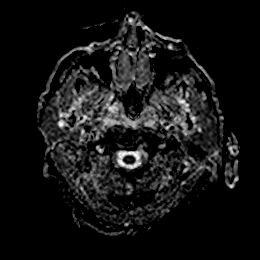
[im 18/52]
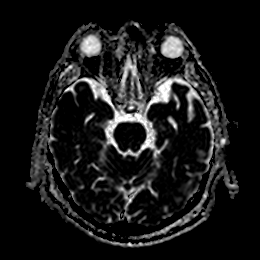
[im 35/52]
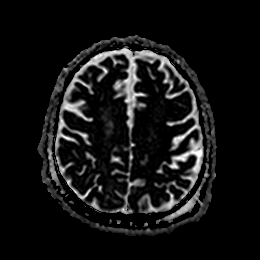
[im 52/52]
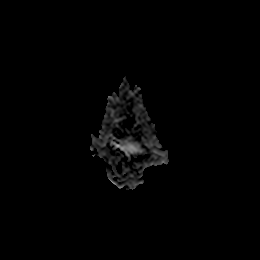

[Series 7: DWI · coronal · 4.0mm · 0.88mm/px · 5 of 72 slices shown (3 of 4)]
[im 1/72]
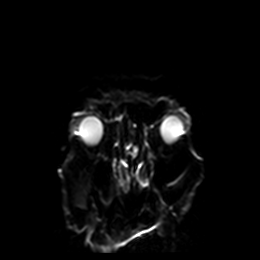
[im 18/72]
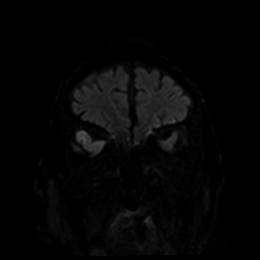
[im 36/72]
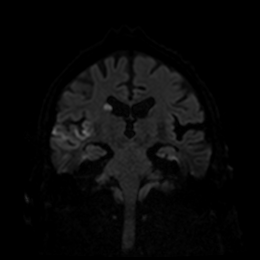
[im 54/72]
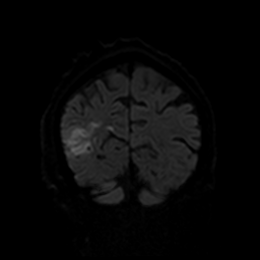
[im 72/72]
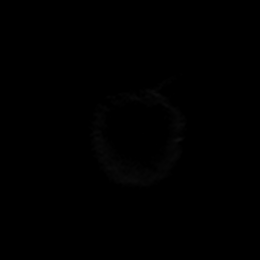

[Series 8: DWI · coronal · 4.0mm · 0.88mm/px · 3 of 36 slices shown (4 of 4)]
[im 1/36]
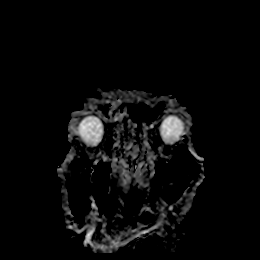
[im 18/36]
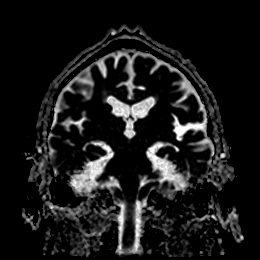
[im 36/36]
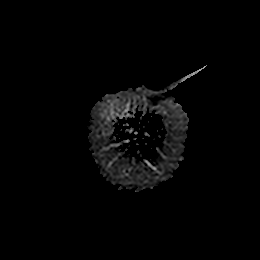

[Series 9: T1 · sagittal · 5.0mm · 0.75mm/px · 2 of 23 slices shown]
[im 1/23]
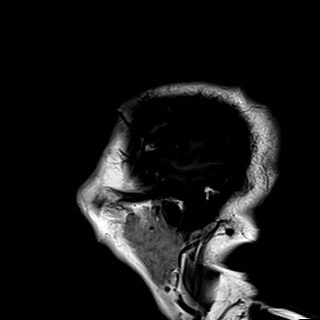
[im 23/23]
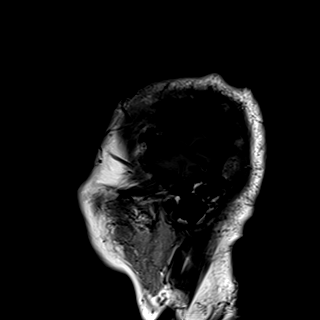

[Series 10: T2 · axial · 5.0mm · 0.72mm/px · z∈[-28,+110]mm · 2 of 25 slices shown (1 of 2)]
[im 1/25]
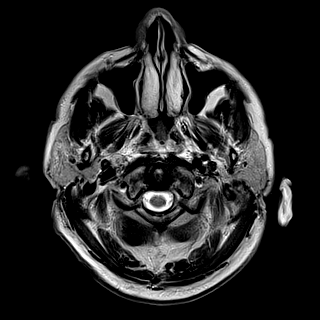
[im 25/25]
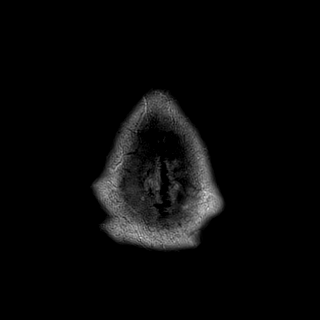

[Series 11: FLAIR · axial · 5.0mm · 0.45mm/px · z∈[-31,+107]mm · 2 of 25 slices shown]
[im 1/25]
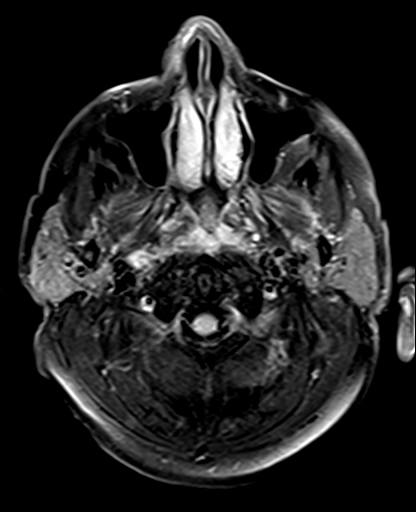
[im 25/25]
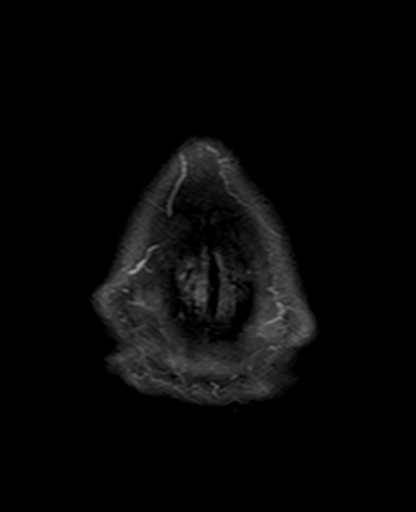

[Series 12: mag_images · axial · 3.0mm · 0.90mm/px · z∈[-43,+127]mm · 4 of 60 slices shown]
[im 1/60]
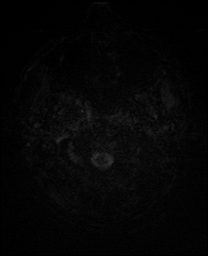
[im 20/60]
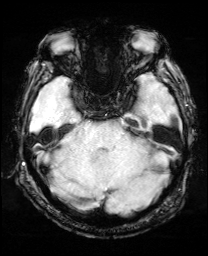
[im 40/60]
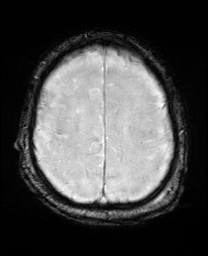
[im 60/60]
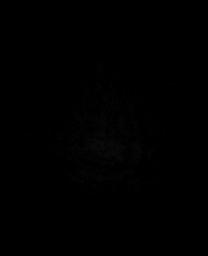

[Series 13: pha_images · axial · 3.0mm · 0.90mm/px · z∈[-40,+127]mm · 4 of 59 slices shown]
[im 1/59]
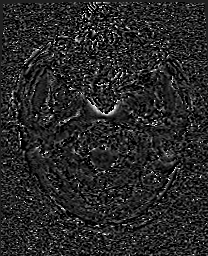
[im 20/59]
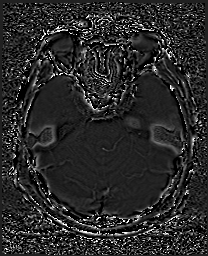
[im 39/59]
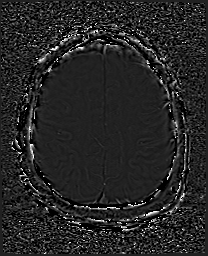
[im 59/59]
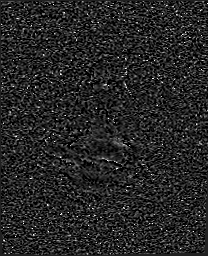

[Series 14: swi_images · axial · 3.0mm · 0.90mm/px · z∈[-43,+127]mm · 4 of 60 slices shown]
[im 1/60]
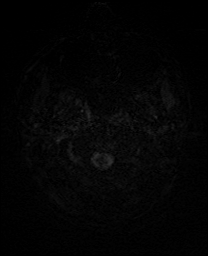
[im 20/60]
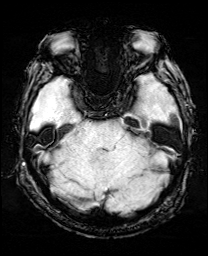
[im 40/60]
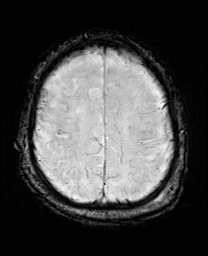
[im 60/60]
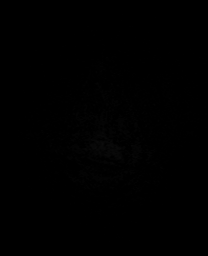

[Series 15: mip_images(sw) · axial · 24.0mm · 0.90mm/px · z∈[-33,+117]mm · 4 of 53 slices shown]
[im 1/53]
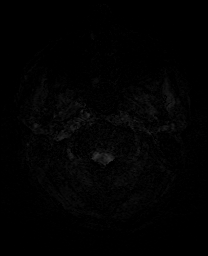
[im 18/53]
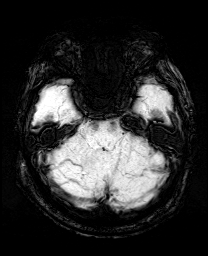
[im 35/53]
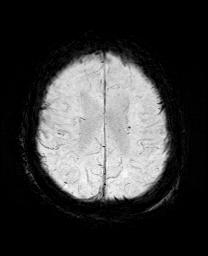
[im 53/53]
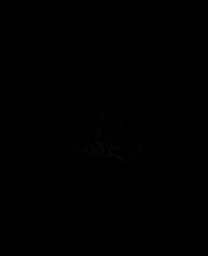

[Series 17: T2 · coronal · 5.0mm · 0.34mm/px · 2 of 29 slices shown (2 of 2)]
[im 1/29]
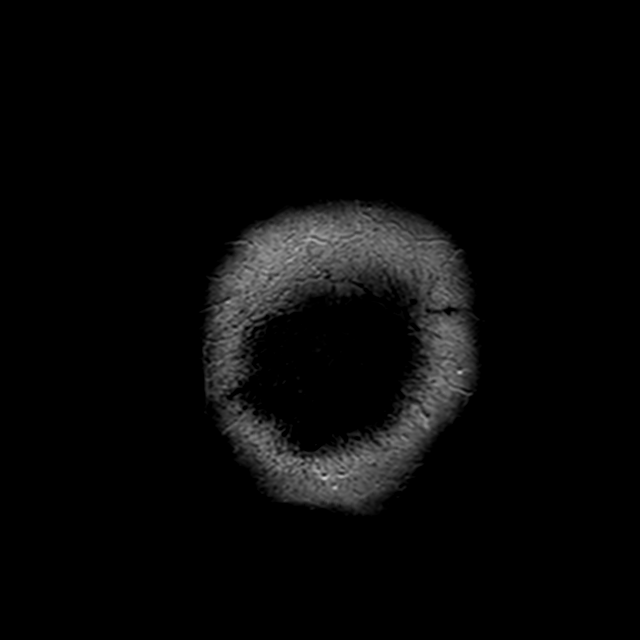
[im 29/29]
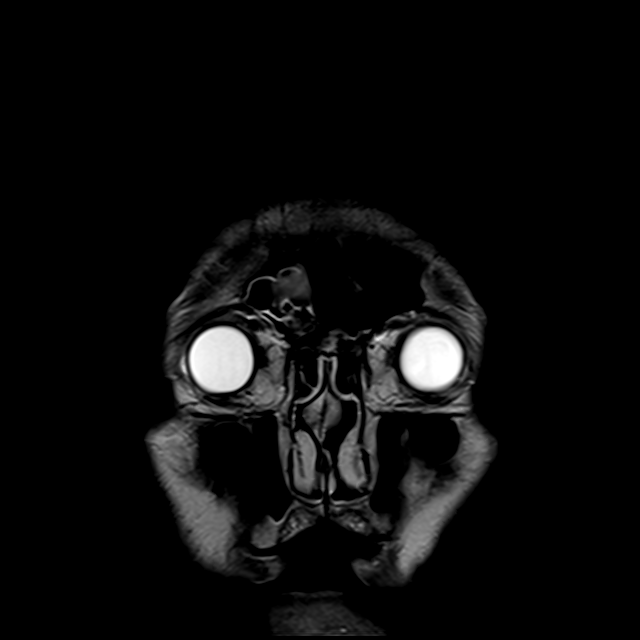

[44 of 48 positions shown; findings below may reference images not displayed]

FINDINGS: Brain: Generalized age-related cerebral atrophy. Scattered patchy
T2/FLAIR hyperintensity within the periventricular and deep white
matter both cerebral hemispheres most consistent with chronic small
vessel ischemic disease, mild in nature.

Patchy multifocal areas of restricted diffusion seen involving the
right MCA distribution, primarily involving the right insula and
adjacent right temporal lobe, with extension to involve the
posterior right frontoparietal region. Involvement of the right
caudate and lentiform nuclei present as well. No associated
hemorrhage or mass effect. Scattered FLAIR hyperintensity seen
within several cortical sulci within the area of infarction without
susceptibility artifact, likely related to vascular congestion. Few
additional punctate foci of diffusion abnormality noted at the
cortical and subcortical posterior left frontal region (series 5,
image 88). No other areas of acute or subacute ischemia. Gray-white
matter differentiation otherwise maintained. No evidence for acute
intracranial hemorrhage. Vague FLAIR signal intensity partially
surrounding the midbrain and pons noted without associated SWI
correlate, favored to be artifactual in nature. Single punctate
chronic microhemorrhage noted within the left frontal centrum semi
ovale, of doubtful significance in isolation.

No mass lesion, midline shift or mass effect. No hydrocephalus. No
extra-axial fluid collection. Pituitary gland suprasellar region
within normal limits. Midline structures intact.

Vascular: Abnormal flow void within the right ICA to the cavernous
segment, consistent with known chronic right ICA occlusion.
Otherwise, major intracranial vascular flow voids are maintained,
with particular note made of preserved flow voids within the right
MCA distribution.

Skull and upper cervical spine: Craniocervical junction within
normal limits. Degenerative spondylosis noted at C3-4 with resultant
mild spinal stenosis. Bone marrow signal intensity within normal
limits. No scalp soft tissue abnormality.

Sinuses/Orbits: Patient status post bilateral ocular lens
replacement. Scattered mucosal thickening noted throughout the
paranasal sinuses, inflammatory/allergic in nature and likely
chronic. No evidence for acute sinusitis. No mastoid effusion. Inner
ear structures grossly normal.

Other: None.
IMPRESSION: 1. Patchy multifocal acute ischemic right MCA territory infarcts as
above. No associated hemorrhage or mass effect.
2. Abnormal flow void within the right ICA to the cavernous segment,
consistent with known chronic right ICA occlusion. Major
intracranial vascular flow voids are otherwise maintained.
3. Underlying age-related cerebral atrophy with mild chronic small
vessel ischemic disease.
